# Patient Record
Sex: Female | Born: 1990 | Race: White | Hispanic: No | Marital: Married | Smoking: Never smoker
Health system: Southern US, Community
[De-identification: ages and names within clinical notes are randomized; demographics above are authoritative.]

## PROBLEM LIST (undated history)

## (undated) DIAGNOSIS — Z789 Other specified health status: Secondary | ICD-10-CM

## (undated) HISTORY — PX: NO PAST SURGERIES: SHX2092

## (undated) HISTORY — PX: WISDOM TOOTH EXTRACTION: SHX21

---

## 2020-04-17 ENCOUNTER — Inpatient Hospital Stay (HOSPITAL_COMMUNITY): Admit: 2020-04-17 | Payer: BC Managed Care – PPO | Admitting: Obstetrics and Gynecology

## 2020-05-13 NOTE — L&D Delivery Note (Addendum)
Delivery Note At 4:47 PM a viable female was delivered via Vaginal, Spontaneous (Presentation: Right Occiput Anterior).  APGAR: 8, 9; weight pending.   Placenta status: Spontaneous, Intact.  Cord: 3 vessels with the following complications: None.  Cord pH: n/a  Anesthesia: Epidural Episiotomy: None Lacerations: 2nd degree;Sulcus Suture Repair: 3.0 vicryl rapide Est. Blood Loss (mL):  400  Mom to postpartum.  Baby to Couplet care / Skin to Skin.  Patient had temp 100.2 at 1547 while pushing. Maternal and fetal tachycardia noted.  Tylenol was given and a single dose of Amp/Gent.  Patient and newborn were afebrile at delivery.  Mitchel Honour 11/11/2020, 5:17 PM

## 2020-09-20 ENCOUNTER — Other Ambulatory Visit: Payer: Self-pay

## 2020-09-20 ENCOUNTER — Ambulatory Visit (INDEPENDENT_AMBULATORY_CARE_PROVIDER_SITE_OTHER): Payer: Self-pay | Admitting: Pediatrics

## 2020-09-20 ENCOUNTER — Encounter: Payer: Self-pay | Admitting: Pediatrics

## 2020-09-20 DIAGNOSIS — Z7681 Expectant parent(s) prebirth pediatrician visit: Secondary | ICD-10-CM

## 2020-09-20 NOTE — Progress Notes (Signed)
Prenatal counseling for impending newborn done-- Z76.81  

## 2020-10-23 ENCOUNTER — Inpatient Hospital Stay (HOSPITAL_COMMUNITY): Payer: BC Managed Care – PPO

## 2020-10-23 ENCOUNTER — Inpatient Hospital Stay (HOSPITAL_COMMUNITY)
Admission: AD | Admit: 2020-10-23 | Discharge: 2020-10-23 | Disposition: A | Discharge status: Home / Self Care | Payer: BC Managed Care – PPO | Attending: Obstetrics & Gynecology | Admitting: Obstetrics & Gynecology

## 2020-10-23 ENCOUNTER — Encounter (HOSPITAL_COMMUNITY): Payer: Self-pay | Admitting: *Deleted

## 2020-10-23 DIAGNOSIS — R030 Elevated blood-pressure reading, without diagnosis of hypertension: Secondary | ICD-10-CM | POA: Diagnosis not present

## 2020-10-23 DIAGNOSIS — O99891 Other specified diseases and conditions complicating pregnancy: Secondary | ICD-10-CM | POA: Diagnosis not present

## 2020-10-23 DIAGNOSIS — O26893 Other specified pregnancy related conditions, third trimester: Secondary | ICD-10-CM

## 2020-10-23 DIAGNOSIS — Z3689 Encounter for other specified antenatal screening: Secondary | ICD-10-CM

## 2020-10-23 DIAGNOSIS — Z3A36 36 weeks gestation of pregnancy: Secondary | ICD-10-CM | POA: Insufficient documentation

## 2020-10-23 DIAGNOSIS — R1011 Right upper quadrant pain: Secondary | ICD-10-CM

## 2020-10-23 DIAGNOSIS — O1493 Unspecified pre-eclampsia, third trimester: Secondary | ICD-10-CM | POA: Diagnosis not present

## 2020-10-23 DIAGNOSIS — H538 Other visual disturbances: Secondary | ICD-10-CM | POA: Diagnosis not present

## 2020-10-23 LAB — URINALYSIS, ROUTINE W REFLEX MICROSCOPIC
Bilirubin Urine: NEGATIVE
Glucose, UA: NEGATIVE mg/dL
Hgb urine dipstick: NEGATIVE
Ketones, ur: NEGATIVE mg/dL
Nitrite: NEGATIVE
Protein, ur: NEGATIVE mg/dL
Specific Gravity, Urine: 1.009 (ref 1.005–1.030)
pH: 6 (ref 5.0–8.0)

## 2020-10-23 LAB — CBC
HCT: 36.7 % (ref 36.0–46.0)
Hemoglobin: 12.5 g/dL (ref 12.0–15.0)
MCH: 29.4 pg (ref 26.0–34.0)
MCHC: 34.1 g/dL (ref 30.0–36.0)
MCV: 86.4 fL (ref 80.0–100.0)
Platelets: 341 10*3/uL (ref 150–400)
RBC: 4.25 MIL/uL (ref 3.87–5.11)
RDW: 12 % (ref 11.5–15.5)
WBC: 15.6 10*3/uL — ABNORMAL HIGH (ref 4.0–10.5)
nRBC: 0 % (ref 0.0–0.2)

## 2020-10-23 LAB — COMPREHENSIVE METABOLIC PANEL
ALT: 14 U/L (ref 0–44)
AST: 16 U/L (ref 15–41)
Albumin: 2.9 g/dL — ABNORMAL LOW (ref 3.5–5.0)
Alkaline Phosphatase: 149 U/L — ABNORMAL HIGH (ref 38–126)
Anion gap: 9 (ref 5–15)
BUN: 9 mg/dL (ref 6–20)
CO2: 20 mmol/L — ABNORMAL LOW (ref 22–32)
Calcium: 8.6 mg/dL — ABNORMAL LOW (ref 8.9–10.3)
Chloride: 105 mmol/L (ref 98–111)
Creatinine, Ser: 0.78 mg/dL (ref 0.44–1.00)
GFR, Estimated: >60 mL/min (ref >60)
Glucose, Bld: 108 mg/dL — ABNORMAL HIGH (ref 70–99)
Potassium: 3.6 mmol/L (ref 3.5–5.1)
Sodium: 134 mmol/L — ABNORMAL LOW (ref 135–145)
Total Bilirubin: 0.5 mg/dL (ref 0.3–1.2)
Total Protein: 6 g/dL — ABNORMAL LOW (ref 6.5–8.1)

## 2020-10-23 LAB — PROTEIN / CREATININE RATIO, URINE
Creatinine, Urine: 68.04 mg/dL
Protein Creatinine Ratio: 0.1 mg/mg{Cre} (ref 0.00–0.15)
Total Protein, Urine: 7 mg/dL

## 2020-10-23 NOTE — MAU Note (Signed)
Pt reports visual blurring has resolved. Pt denies headache or any other discomfort. Notified pt RN called and pt should be called for within the hour unless an emergency called-pt will then go to ultrasound. Pt and husband inquiring if they will need to stay for results.

## 2020-10-23 NOTE — MAU Note (Signed)
.  Wendy Mason is a 30 y.o. at [redacted]w[redacted]d here in MAU reporting: x1 episode of blurry vision that happened x 30 minutes ago. Denies HA and swelling. Reports epigastric pain that started Friday. Called her on call doctor and they sent her in to be evaluated. Denies previous BP problems. Endorses fetal movement. Denies VB or LOF.   Pain score:6 Vitals:   10/23/20 1634  BP: (!) 147/101  Pulse: (!) 137  Resp: 19  Temp: 97.9 F (36.6 C)  SpO2: 98%     FHT: 158 Lab orders placed from triage:  UA

## 2020-10-23 NOTE — MAU Provider Note (Signed)
History     CSN: [id number]  Arrival date and time: 10/23/20 1603   Event Date/Time   First Provider Initiated Contact with Patient 10/23/20 1738      Chief Complaint  Patient presents with   Blurred Vision   Ms. Wendy Mason is a 30 y.o. G1P0 at [redacted]w[redacted]d who presents to MAU for preeclampsia evaluation after she experienced an episode of blurry vision x1 today. Patient denies previous episodes of blurry vision. Patient denies wearing glasses or contacts. Patient reports blurry vision was present in both eyes, but reports it was a blurry line in her vision that would not go away with blinking. Patient reports the line was present, did not change shape, and then just disappeared suddenly. Patient originally thought that she was just seeing a glare, but then reports the glare turned in to a blurry line. Patient denies a headache before, during or after this episode. Patient reports frequent HA and reports occasional migraines both during and before pregnancy. Patient reports she did not try any treatment. Patient reports she was watching TV when the blurry vision started. Patient reports she called her OB and was told to come in to MAU for evaluation.  Patient reports she has also been having RUQ pain that started on Friday 10/20/2020. Patient reports the pain has stayed the same since that time. Patient rates the pain as 4/10. Patient reports she has not taken anything for the pain. Patient denies any history of gallstones.  Pt denies seeing spots, N/V, epigastric pain, swelling in face and hands, sudden weight gain. Pt denies chest pain and SOB.  Pt denies constipation, diarrhea, or urinary problems. Pt denies fever, chills, fatigue, sweating or changes in appetite. Pt denies dizziness, light-headedness, weakness.  Pt denies VB, ctx, LOF and reports good FM.  Current pregnancy problems? none Allergies? NKDA Current medications? PNV, VitD Current PNC & next appt? [facility],  10/25/2020   OB History     Gravida  1   Para      Term      Preterm      AB      Living         SAB      IAB      Ectopic      Multiple      Live Births              History reviewed. No pertinent past medical history.  Past Surgical History:  Procedure Laterality Date   WISDOM TOOTH EXTRACTION      History reviewed. No pertinent family history.  Social History   Tobacco Use   Smoking status: Never   Smokeless tobacco: Never  Substance Use Topics   Alcohol use: Not Currently   Drug use: Never    Allergies: No Known Allergies  Medications Prior to Admission  Medication Sig Dispense Refill Last Dose   cholecalciferol (VITAMIN D3) 25 MCG (1000 UNIT) tablet Take 1,000 Units by mouth daily.   10/22/2020   Prenatal Vit-Fe Fumarate-FA (MULTIVITAMIN-PRENATAL) 27-0.8 MG TABS tablet Take 1 tablet by mouth daily at 12 noon.   10/22/2020    Review of Systems  Constitutional:  Negative for chills, diaphoresis, fatigue and fever.  Eyes:  Positive for visual disturbance.  Respiratory:  Negative for shortness of breath.   Cardiovascular:  Negative for chest pain.  Gastrointestinal:  Positive for abdominal pain ((RUQ)). Negative for constipation, diarrhea, nausea and vomiting.  Genitourinary:  Negative for dysuria, flank pain, frequency,  pelvic pain, urgency, vaginal bleeding and vaginal discharge.  Neurological:  Negative for dizziness, weakness, light-headedness and headaches.  Physical Exam   Blood pressure 122/74, pulse 99, temperature 97.9 F (36.6 C), temperature source Oral, resp. rate 17, SpO2 98 %.  Patient Vitals for the past 24 hrs:  BP Temp Temp src Pulse Resp SpO2  10/23/20 1937 122/74 -- -- 99 17 --  10/23/20 1850 -- -- -- -- -- 98 %  10/23/20 1846 126/85 -- -- (!) 114 -- --  10/23/20 1845 -- -- -- -- -- 97 %  10/23/20 1840 -- -- -- -- -- 98 %  10/23/20 1831 119/77 -- -- (!) 113 -- --  10/23/20 1816 115/81 -- -- (!) 110 -- --  10/23/20  1801 118/79 -- -- (!) 107 -- --  10/23/20 1730 127/89 -- -- (!) 105 -- 97 %  10/23/20 1716 127/82 -- -- (!) 113 -- --  10/23/20 1701 (!) 122/93 -- -- (!) 117 -- --  10/23/20 1646 (!) 140/95 -- -- (!) 120 -- --  10/23/20 1634 (!) 147/101 97.9 F (36.6 C) Oral (!) 137 19 98 %   Physical Exam Vitals and nursing note reviewed.  Constitutional:      General: She is not in acute distress.    Appearance: Normal appearance. She is not ill-appearing, toxic-appearing or diaphoretic.  HENT:     Head: Normocephalic and atraumatic.  Eyes:     Pupils: Pupils are equal, round, and reactive to light.  Pulmonary:     Effort: Pulmonary effort is normal.  Abdominal:     General: There is no distension.     Palpations: Abdomen is soft. There is no mass.     Tenderness: There is no abdominal tenderness. There is no guarding.  Neurological:     Mental Status: She is alert and oriented to person, place, and time.  Psychiatric:        Mood and Affect: Mood normal.        Behavior: Behavior normal.        Thought Content: Thought content normal.        Judgment: Judgment normal.   Results for orders placed or performed during the hospital encounter of 10/23/20 (from the past 24 hour(s))  Urinalysis, Routine w reflex microscopic Urine, Clean Catch     Status: Abnormal   Collection Time: 10/23/20  4:40 PM  Result Value Ref Range   Color, Urine YELLOW YELLOW   APPearance HAZY (A) CLEAR   Specific Gravity, Urine 1.009 1.005 - 1.030   pH 6.0 5.0 - 8.0   Glucose, UA NEGATIVE NEGATIVE mg/dL   Hgb urine dipstick NEGATIVE NEGATIVE   Bilirubin Urine NEGATIVE NEGATIVE   Ketones, ur NEGATIVE NEGATIVE mg/dL   Protein, ur NEGATIVE NEGATIVE mg/dL   Nitrite NEGATIVE NEGATIVE   Leukocytes,Ua MODERATE (A) NEGATIVE   RBC / HPF 0-5 0 - 5 RBC/hpf   WBC, UA 6-10 0 - 5 WBC/hpf   Bacteria, UA FEW (A) NONE SEEN   Squamous Epithelial / LPF 0-5 0 - 5  Protein / creatinine ratio, urine     Status: None   Collection  Time: 10/23/20  4:48 PM  Result Value Ref Range   Creatinine, Urine 68.04 mg/dL   Total Protein, Urine 7 mg/dL   Protein Creatinine Ratio 0.10 0.00 - 0.15 mg/mg[Cre]  CBC     Status: Abnormal   Collection Time: 10/23/20  4:52 PM  Result Value Ref Range   WBC 15.6 (  H) 4.0 - 10.5 K/uL   RBC 4.25 3.87 - 5.11 MIL/uL   Hemoglobin 12.5 12.0 - 15.0 g/dL   HCT 81.1 91.4 - 78.2 %   MCV 86.4 80.0 - 100.0 fL   MCH 29.4 26.0 - 34.0 pg   MCHC 34.1 30.0 - 36.0 g/dL   RDW 95.6 21.3 - 08.6 %   Platelets 341 150 - 400 K/uL   nRBC 0.0 0.0 - 0.2 %  Comprehensive metabolic panel     Status: Abnormal   Collection Time: 10/23/20  4:52 PM  Result Value Ref Range   Sodium 134 (L) 135 - 145 mmol/L   Potassium 3.6 3.5 - 5.1 mmol/L   Chloride 105 98 - 111 mmol/L   CO2 20 (L) 22 - 32 mmol/L   Glucose, Bld 108 (H) 70 - 99 mg/dL   BUN 9 6 - 20 mg/dL   Creatinine, Ser 5.78 0.44 - 1.00 mg/dL   Calcium 8.6 (L) 8.9 - 10.3 mg/dL   Total Protein 6.0 (L) 6.5 - 8.1 g/dL   Albumin 2.9 (L) 3.5 - 5.0 g/dL   AST 16 15 - 41 U/L   ALT 14 0 - 44 U/L   Alkaline Phosphatase 149 (H) 38 - 126 U/L   Total Bilirubin 0.5 0.3 - 1.2 mg/dL   GFR, Estimated >46 >96 mL/min   Anion gap 9 5 - 15   MR BRAIN WO CONTRAST  Result Date: 10/23/2020 CLINICAL DATA:  Headache and vision changes EXAM: MRI HEAD WITHOUT CONTRAST TECHNIQUE: Multiplanar, multiecho pulse sequences of the brain and surrounding structures were obtained without intravenous contrast. COMPARISON:  None. FINDINGS: Brain: No acute infarct, mass effect or extra-axial collection. No acute or chronic hemorrhage. Normal white matter signal, parenchymal volume and CSF spaces. The midline structures are normal. Vascular: Major flow voids are preserved. Skull and upper cervical spine: Normal calvarium and skull base. Visualized upper cervical spine and soft tissues are normal. Sinuses/Orbits:No paranasal sinus fluid levels or advanced mucosal thickening. No mastoid or middle ear  effusion. Normal orbits. IMPRESSION: Normal brain MRI. Electronically Signed   By: Deatra Robinson M.D.   On: 10/23/2020 21:34   MR MRV HEAD WO CM  Result Date: 10/23/2020 CLINICAL DATA:  Headache and blurry vision EXAM: MR VENOGRAM HEAD WITHOUT AND WITH CONTRAST TECHNIQUE: Angiographic images of the intracranial venous structures were acquired using MRV technique without intravenous contrast. COMPARISON:  No pertinent prior exam. FINDINGS: Superior sagittal sinus: Normal. Straight sinus: Normal. Inferior sagittal sinus, vein of Galen and internal cerebral veins: Normal. Transverse sinuses: Minimal flow related enhancement of the left transverse sinus, commonly congenital. Normal right transverse sinus. Sigmoid sinuses: Normal. Visualized jugular veins: Normal. 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 Otherwise normal MRV. Electronically Signed   By: Deatra Robinson M.D.   On: 10/23/2020 21:35   US Abdomen Limited RUQ (LIVER/GB)  Result Date: 10/23/2020 CLINICAL DATA:  30 year old female with right upper quadrant abdominal pain. EXAM: ULTRASOUND ABDOMEN LIMITED RIGHT UPPER QUADRANT COMPARISON:  None. FINDINGS: Gallbladder: No gallstones or wall thickening visualized. No sonographic Murphy sign noted by sonographer. Common bile duct: Diameter: 3 mm Liver: No focal lesion identified. Within normal limits in parenchymal echogenicity. Portal vein is patent on color Doppler imaging with normal direction of blood flow towards the liver. Other: None. IMPRESSION: Unremarkable right upper quadrant ultrasound. Electronically Signed   By: Elgie Collard M.D.   On: 10/23/2020 21:38    MAU Course  Procedures  MDM -preeclampsia evaluation without  severe range BP in MAU on admission -symptoms include: blurry vision (no longer present), and RUQ pain (not reproducible on exam) -UA: hazy/mod leuks/few bacteria, sending urine for culture -CBC: H/H 12.5/36.7, platelets 341 -CMP: serum  creatinine 0.78, AST/ALT 16/14 -PCr: 0.10 -EFM: reactive       -baseline: 150/140       -variability: moderate       -accels: present, 15x15       -decels: absent       -TOCO: irritability -called Dr. 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 Otherwise normal MRV. Normal brain MRI.  -consulted with Dr. Jerrell Belfast from neurology who reviewed images and reports MRI is abnormal, but MRV is abnormal with missing venous sinus on one side, whose significance is unknown, and could be congenital. However, Dr. Jerrell Belfast, after speaking with neurology does not report any evidence of thrombosis and she can follow-up outpatient with neurology, specifically Dr. Elam Dutch Neurology - HA specialist  -RUQ [place]: WNL  -pt discharged to home in stable condition  Orders Placed This Encounter  Procedures   Culture, OB Urine    Standing Status:   Standing    Number of Occurrences:   1   US Abdomen Limited RUQ (LIVER/GB)    Standing Status:   Standing    Number of Occurrences:   1    Order Specific Question:   Symptom/Reason for Exam    Answer:   RUQ pain [251471]   MR BRAIN WO CONTRAST    Standing Status:   Standing    Number of Occurrences:   1    Order Specific Question:   What is the patient's sedation requirement?    Answer:   No Sedation    Order Specific Question:   Does the patient have a pacemaker or implanted devices?    Answer:   No   MR MRV HEAD WO CM    Standing Status:   Standing    Number of Occurrences:   1    Order Specific Question:   What is the patient's sedation requirement?    Answer:   No Sedation    Order Specific Question:   Does the patient have a pacemaker or implanted devices?    Answer:   No   Urinalysis, Routine w reflex microscopic Urine, Clean Catch    Standing Status:   Standing    Number of Occurrences:   1   CBC    Standing Status:   Standing    Number of Occurrences:   1    Comprehensive metabolic panel    Standing Status:   Standing    Number of Occurrences:   1   Protein / creatinine ratio, urine    Standing Status:   Standing    Number of Occurrences:   1   Ambulatory referral to Neurology    Referral Priority:   Routine    Referral Type:   Consultation    Referral Reason:   Specialty Services Required    Referred to Provider:   Anson Fret, MD    Requested Specialty:   Neurology    Number of Visits Requested:   1   Discharge patient    Order Specific Question:   Discharge disposition    Answer:   01-Home or Self Care [1]    Order Specific Question:   Discharge patient date    Answer:   10/23/2020  No orders of the defined types were placed in this encounter.  Assessment and Plan   1. Elevated blood pressure reading in office without diagnosis of hypertension   2. RUQ pain   3. [redacted] weeks gestation of pregnancy   4. NST (non-stress test) reactive   5. Blurred vision     Allergies as of 10/23/2020   No Known Allergies      Medication List     TAKE these medications    cholecalciferol 25 MCG (1000 UNIT) tablet Commonly known as: VITAMIN D3 Take 1,000 Units by mouth daily.   multivitamin-prenatal 27-0.8 MG Tabs tablet Take 1 tablet by mouth daily at 12 noon.       -pt advised to keep OB appt for 10/25/2020 to check on BP -Reviewed warning blood pressure values (systolic = / > 140 and/or diastolic =/> 90). Explained that, if blood pressure is elevated, she should sit down, rest, and eat/drink something. If still elevated 15 minutes later, and she is greater than 20 weeks, she should call clinic or come to MAU. She should come to MAU if she has elevated pressures and any of the following:  -headache not relieved with tylenol, rest, hydration -blurry vision, floating spots in her vision -sudden full-body edema or facial edema -RUQ pain that is constant. -chest pain or shortness of breath -new onset or sudden worsening of nausea  and vomiting These symptoms may indicate that her blood pressure is worsening and she may be developing gestational hypertension or pre-eclampsia, which is an emergency.  -return MAU precautions given -pt discharged to home in stable condition  Odie Seraicole E Smt. Loder 10/23/2020, 9:48 PM

## 2020-10-25 LAB — CULTURE, OB URINE

## 2020-11-11 ENCOUNTER — Other Ambulatory Visit: Payer: Self-pay

## 2020-11-11 ENCOUNTER — Inpatient Hospital Stay (HOSPITAL_COMMUNITY): Payer: BC Managed Care – PPO | Admitting: Anesthesiology

## 2020-11-11 ENCOUNTER — Inpatient Hospital Stay (HOSPITAL_COMMUNITY)
Admission: AD | Admit: 2020-11-11 | Discharge: 2020-11-13 | DRG: 805 | Disposition: A | Discharge status: Home / Self Care | Payer: BC Managed Care – PPO | Attending: Obstetrics & Gynecology | Admitting: Obstetrics & Gynecology

## 2020-11-11 ENCOUNTER — Encounter (HOSPITAL_COMMUNITY): Payer: Self-pay | Admitting: Obstetrics & Gynecology

## 2020-11-11 DIAGNOSIS — O4292 Full-term premature rupture of membranes, unspecified as to length of time between rupture and onset of labor: Principal | ICD-10-CM | POA: Diagnosis present

## 2020-11-11 DIAGNOSIS — Z20822 Contact with and (suspected) exposure to covid-19: Secondary | ICD-10-CM | POA: Diagnosis present

## 2020-11-11 DIAGNOSIS — D62 Acute posthemorrhagic anemia: Secondary | ICD-10-CM | POA: Diagnosis not present

## 2020-11-11 DIAGNOSIS — O411230 Chorioamnionitis, third trimester, not applicable or unspecified: Secondary | ICD-10-CM | POA: Diagnosis present

## 2020-11-11 DIAGNOSIS — Z23 Encounter for immunization: Secondary | ICD-10-CM | POA: Diagnosis not present

## 2020-11-11 DIAGNOSIS — Z3A38 38 weeks gestation of pregnancy: Secondary | ICD-10-CM

## 2020-11-11 DIAGNOSIS — Z3490 Encounter for supervision of normal pregnancy, unspecified, unspecified trimester: Secondary | ICD-10-CM

## 2020-11-11 DIAGNOSIS — O9081 Anemia of the puerperium: Secondary | ICD-10-CM | POA: Diagnosis not present

## 2020-11-11 DIAGNOSIS — O99824 Streptococcus B carrier state complicating childbirth: Secondary | ICD-10-CM | POA: Diagnosis present

## 2020-11-11 HISTORY — DX: Other specified health status: Z78.9

## 2020-11-11 LAB — CBC
HCT: 36.8 % (ref 36.0–46.0)
Hemoglobin: 12.3 g/dL (ref 12.0–15.0)
MCH: 29.1 pg (ref 26.0–34.0)
MCHC: 33.4 g/dL (ref 30.0–36.0)
MCV: 87.2 fL (ref 80.0–100.0)
Platelets: 350 10*3/uL (ref 150–400)
RBC: 4.22 MIL/uL (ref 3.87–5.11)
RDW: 12.3 % (ref 11.5–15.5)
WBC: 15.4 10*3/uL — ABNORMAL HIGH (ref 4.0–10.5)
nRBC: 0 % (ref 0.0–0.2)

## 2020-11-11 LAB — TYPE AND SCREEN
ABO/RH(D): A POS
Antibody Screen: NEGATIVE

## 2020-11-11 LAB — RESP PANEL BY RT-PCR (FLU A&B, COVID) ARPGX2
Influenza A by PCR: NEGATIVE
Influenza B by PCR: NEGATIVE
SARS Coronavirus 2 by RT PCR: NEGATIVE

## 2020-11-11 LAB — RPR: RPR Ser Ql: NONREACTIVE

## 2020-11-11 MED ORDER — EPHEDRINE 5 MG/ML INJ: 10.0000 mg | INTRAVENOUS | Status: DC | PRN

## 2020-11-11 MED ORDER — DIPHENHYDRAMINE HCL 25 MG PO CAPS: 25.0000 mg | ORAL_CAPSULE | Freq: Four times a day (QID) | ORAL | Status: DC | PRN

## 2020-11-11 MED ORDER — OXYCODONE-ACETAMINOPHEN 5-325 MG PO TABS
2.0000 | ORAL_TABLET | ORAL | Status: DC | PRN
Start: 2020-11-11 — End: 2020-11-11

## 2020-11-11 MED ORDER — DIBUCAINE (PERIANAL) 1 % EX OINT: 1.0000 "application " | TOPICAL_OINTMENT | CUTANEOUS | Status: DC | PRN

## 2020-11-11 MED ORDER — ACETAMINOPHEN 325 MG PO TABS
650.0000 mg | ORAL_TABLET | ORAL | Status: DC | PRN
  Administered 2020-11-11: 650 mg via ORAL
  Filled 2020-11-11: qty 2

## 2020-11-11 MED ORDER — PRENATAL MULTIVITAMIN CH
1.0000 | ORAL_TABLET | Freq: Every day | ORAL | Status: DC
  Administered 2020-11-12 – 2020-11-13 (×2): 1 via ORAL
  Filled 2020-11-11 (×2): qty 1

## 2020-11-11 MED ORDER — TETANUS-DIPHTH-ACELL PERTUSSIS 5-2.5-18.5 LF-MCG/0.5 IM SUSY: 0.5000 mL | PREFILLED_SYRINGE | Freq: Once | INTRAMUSCULAR | Status: DC

## 2020-11-11 MED ORDER — COCONUT OIL OIL: 1.0000 "application " | TOPICAL_OIL | Status: DC | PRN

## 2020-11-11 MED ORDER — FLEET ENEMA 7-19 GM/118ML RE ENEM: 1.0000 | ENEMA | RECTAL | Status: DC | PRN

## 2020-11-11 MED ORDER — GENTAMICIN SULFATE 40 MG/ML IJ SOLN
5.0000 mg/kg | Freq: Once | INTRAVENOUS | Status: AC
  Administered 2020-11-11: 340 mg via INTRAVENOUS
  Filled 2020-11-11: qty 8.5

## 2020-11-11 MED ORDER — FENTANYL-BUPIVACAINE-NACL 0.5-0.125-0.9 MG/250ML-% EP SOLN
12.0000 mL/h | EPIDURAL | Status: DC | PRN
  Administered 2020-11-11: 12 mL/h via EPIDURAL
  Filled 2020-11-11: qty 250

## 2020-11-11 MED ORDER — ZOLPIDEM TARTRATE 5 MG PO TABS: 5.0000 mg | ORAL_TABLET | Freq: Every evening | ORAL | Status: DC | PRN

## 2020-11-11 MED ORDER — WITCH HAZEL-GLYCERIN EX PADS: 1.0000 "application " | MEDICATED_PAD | CUTANEOUS | Status: DC | PRN

## 2020-11-11 MED ORDER — SODIUM CHLORIDE 0.9 % IV SOLN
2.0000 g | Freq: Once | INTRAVENOUS | Status: AC
  Administered 2020-11-11: 2 g via INTRAVENOUS
  Filled 2020-11-11: qty 2000

## 2020-11-11 MED ORDER — PHENYLEPHRINE 40 MCG/ML (10ML) SYRINGE FOR IV PUSH (FOR BLOOD PRESSURE SUPPORT): 80.0000 ug | PREFILLED_SYRINGE | INTRAVENOUS | Status: DC | PRN

## 2020-11-11 MED ORDER — TERBUTALINE SULFATE 1 MG/ML IJ SOLN: 0.2500 mg | Freq: Once | INTRAMUSCULAR | Status: DC | PRN

## 2020-11-11 MED ORDER — TRANEXAMIC ACID-NACL 1000-0.7 MG/100ML-% IV SOLN
INTRAVENOUS | Status: AC
  Filled 2020-11-11: qty 100

## 2020-11-11 MED ORDER — SENNOSIDES-DOCUSATE SODIUM 8.6-50 MG PO TABS
2.0000 | ORAL_TABLET | Freq: Every day | ORAL | Status: DC
  Administered 2020-11-12 – 2020-11-13 (×2): 2 via ORAL
  Filled 2020-11-11 (×2): qty 2

## 2020-11-11 MED ORDER — ONDANSETRON HCL 4 MG/2ML IJ SOLN: 4.0000 mg | Freq: Four times a day (QID) | INTRAMUSCULAR | Status: DC | PRN

## 2020-11-11 MED ORDER — OXYCODONE-ACETAMINOPHEN 5-325 MG PO TABS: 2.0000 | ORAL_TABLET | ORAL | Status: DC | PRN

## 2020-11-11 MED ORDER — SIMETHICONE 80 MG PO CHEW: 80.0000 mg | CHEWABLE_TABLET | ORAL | Status: DC | PRN

## 2020-11-11 MED ORDER — OXYCODONE-ACETAMINOPHEN 5-325 MG PO TABS: 1.0000 | ORAL_TABLET | ORAL | Status: DC | PRN

## 2020-11-11 MED ORDER — LACTATED RINGERS IV SOLN: INTRAVENOUS | Status: DC

## 2020-11-11 MED ORDER — IBUPROFEN 600 MG PO TABS
600.0000 mg | ORAL_TABLET | Freq: Four times a day (QID) | ORAL | Status: DC
  Administered 2020-11-12 – 2020-11-13 (×7): 600 mg via ORAL
  Filled 2020-11-11 (×7): qty 1

## 2020-11-11 MED ORDER — LACTATED RINGERS IV SOLN
500.0000 mL | INTRAVENOUS | Status: DC | PRN
  Administered 2020-11-11: 500 mL via INTRAVENOUS

## 2020-11-11 MED ORDER — LACTATED RINGERS IV SOLN
500.0000 mL | Freq: Once | INTRAVENOUS | Status: AC
  Administered 2020-11-11: 500 mL via INTRAVENOUS

## 2020-11-11 MED ORDER — LIDOCAINE HCL (PF) 1 % IJ SOLN: 30.0000 mL | INTRAMUSCULAR | Status: DC | PRN

## 2020-11-11 MED ORDER — ONDANSETRON HCL 4 MG/2ML IJ SOLN: 4.0000 mg | INTRAMUSCULAR | Status: DC | PRN

## 2020-11-11 MED ORDER — FENTANYL CITRATE (PF) 100 MCG/2ML IJ SOLN: 50.0000 ug | INTRAMUSCULAR | Status: DC | PRN

## 2020-11-11 MED ORDER — PENICILLIN G POT IN DEXTROSE 60000 UNIT/ML IV SOLN
3.0000 10*6.[IU] | INTRAVENOUS | Status: DC
  Administered 2020-11-11 (×2): 3 10*6.[IU] via INTRAVENOUS
  Filled 2020-11-11 (×5): qty 50

## 2020-11-11 MED ORDER — LIDOCAINE HCL (PF) 1 % IJ SOLN
INTRAMUSCULAR | Status: DC | PRN
  Administered 2020-11-11: 3 mL via EPIDURAL

## 2020-11-11 MED ORDER — ONDANSETRON HCL 4 MG PO TABS: 4.0000 mg | ORAL_TABLET | ORAL | Status: DC | PRN

## 2020-11-11 MED ORDER — OXYTOCIN BOLUS FROM INFUSION: 333.0000 mL | Freq: Once | INTRAVENOUS | Status: DC

## 2020-11-11 MED ORDER — OXYTOCIN-SODIUM CHLORIDE 30-0.9 UT/500ML-% IV SOLN
1.0000 m[IU]/min | INTRAVENOUS | Status: DC
  Administered 2020-11-11: 2 m[IU]/min via INTRAVENOUS
  Filled 2020-11-11: qty 500

## 2020-11-11 MED ORDER — TRANEXAMIC ACID-NACL 1000-0.7 MG/100ML-% IV SOLN: 1000.0000 mg | Freq: Once | INTRAVENOUS | Status: DC

## 2020-11-11 MED ORDER — BENZOCAINE-MENTHOL 20-0.5 % EX AERO
1.0000 "application " | INHALATION_SPRAY | CUTANEOUS | Status: DC | PRN
  Administered 2020-11-12: 1 via TOPICAL
  Filled 2020-11-11: qty 56

## 2020-11-11 MED ORDER — SOD CITRATE-CITRIC ACID 500-334 MG/5ML PO SOLN
30.0000 mL | ORAL | Status: DC | PRN
  Filled 2020-11-11: qty 30

## 2020-11-11 MED ORDER — DIPHENHYDRAMINE HCL 50 MG/ML IJ SOLN: 12.5000 mg | INTRAMUSCULAR | Status: DC | PRN

## 2020-11-11 MED ORDER — ACETAMINOPHEN 325 MG PO TABS: 650.0000 mg | ORAL_TABLET | ORAL | Status: DC | PRN

## 2020-11-11 MED ORDER — SODIUM CHLORIDE 0.9 % IV SOLN
5.0000 10*6.[IU] | Freq: Once | INTRAVENOUS | Status: AC
  Administered 2020-11-11: 5 10*6.[IU] via INTRAVENOUS
  Filled 2020-11-11: qty 5

## 2020-11-11 MED ORDER — OXYTOCIN-SODIUM CHLORIDE 30-0.9 UT/500ML-% IV SOLN: 2.5000 [IU]/h | INTRAVENOUS | Status: DC

## 2020-11-11 NOTE — H&P (Signed)
Wendy Mason is a 30 y.o. female G1 at [redacted]w[redacted]d presenting for PROM at 1130 pm last night.  She reports just recently CTX became more noticeable.  No VB.  Active FM.  Antepartum course uncomplicated.  GBS positive.  OB History     Gravida  1   Para      Term      Preterm      AB      Living         SAB      IAB      Ectopic      Multiple      Live Births             Past Medical History:  Diagnosis Date   Medical history non-contributory    Past Surgical History:  Procedure Laterality Date   NO PAST SURGERIES     WISDOM TOOTH EXTRACTION     Family History: family history is not on file. Social History:  reports that she has never smoked. She has never used smokeless tobacco. She reports previous alcohol use. She reports that she does not use drugs.     Maternal Diabetes: No Genetic Screening: Normal Maternal Ultrasounds/Referrals: Normal Fetal Ultrasounds or other Referrals:  None Maternal Substance Abuse:  No Significant Maternal Medications:  None Significant Maternal Lab Results:  Group B Strep positive Other Comments:  None  Review of Systems Maternal Medical History:  Reason for admission: Rupture of membranes.   Contractions: Onset was 1-2 hours ago.   Frequency: irregular.   Perceived severity is mild.   Fetal activity: Perceived fetal activity is normal.   Last perceived fetal movement was within the past hour.   Prenatal complications: no prenatal complications Prenatal Complications - Diabetes: none.  Dilation: 3.5 Effacement (%): 80 Station: -2 Exam by:: Derinda Late, RN Blood pressure (!) 132/95, pulse (!) 123, temperature (!) 97.4 F (36.3 C), temperature source Oral, height 5\' 3"  (1.6 m), weight 91.9 kg, SpO2 99 %. Maternal Exam:  Uterine Assessment: Contraction strength is mild.  Contraction frequency is irregular.  Abdomen: Patient reports no abdominal tenderness. Fundal height is c/w dates.   Estimated fetal weight is 7#6.      Fetal Exam Fetal Monitor Review: Baseline rate: 140.  Variability: moderate (6-25 bpm).   Pattern: accelerations present and no decelerations.   Fetal State Assessment: Category I - tracings are normal.  Physical Exam Constitutional:      Appearance: Normal appearance.  HENT:     Head: Normocephalic and atraumatic.  Pulmonary:     Effort: Pulmonary effort is normal.  Abdominal:     Palpations: Abdomen is soft.  Musculoskeletal:        General: Normal range of motion.     Cervical back: Normal range of motion.  Neurological:     Mental Status: She is alert and oriented to person, place, and time.  Psychiatric:        Mood and Affect: Mood normal.        Behavior: Behavior normal.    Prenatal labs: ABO, Rh: --/--/A POS (07/02 0135) Antibody: NEG (07/02 0135) Rubella:  Non-immune RPR:   NR HBsAg:   Negative HIV:   NR GBS:   Positive  Assessment/Plan: 29yo G1 at [redacted]w[redacted]d with PROM -Pitocin started -CLEA if desired -PCN for GBS ppx -Anticipate NSVD   [redacted]w[redacted]d 11/11/2020, 7:01 AM

## 2020-11-11 NOTE — Anesthesia Procedure Notes (Addendum)
Epidural Patient location during procedure: OB Start time: 11/11/2020 8:17 AM End time: 11/11/2020 8:35 AM  Staffing Anesthesiologist: Lewie Loron, MD Performed: anesthesiologist   Preanesthetic Checklist Completed: patient identified, IV checked, risks and benefits discussed, monitors and equipment checked, pre-op evaluation and timeout performed  Epidural Patient position: sitting Prep: DuraPrep and site prepped and draped Patient monitoring: heart rate, continuous pulse ox and blood pressure Approach: midline Location: L3-L4 Injection technique: LOR air and LOR saline  Needle:  Needle type: Tuohy  Needle gauge: 17 G Needle length: 9 cm Needle insertion depth: 5.5 cm Catheter type: closed end flexible Catheter size: 19 Gauge Catheter at skin depth: 11 cm Test dose: negative  Assessment Sensory level: T8 Events: blood not aspirated, injection not painful, no injection resistance, no paresthesia and negative IV test  Additional Notes Reason for block:procedure for pain

## 2020-11-11 NOTE — Anesthesia Preprocedure Evaluation (Signed)
Anesthesia Evaluation  Patient identified by MRN, date of birth, ID band Patient awake    Reviewed: Allergy & Precautions, Patient's Chart, lab work & pertinent test results  Airway Mallampati: II  TM Distance: >3 FB Neck ROM: Full    Dental no notable dental hx.    Pulmonary neg pulmonary ROS,    Pulmonary exam normal breath sounds clear to auscultation       Cardiovascular negative cardio ROS Normal cardiovascular exam Rhythm:Regular Rate:Normal     Neuro/Psych negative neurological ROS     GI/Hepatic negative GI ROS, Neg liver ROS,   Endo/Other  negative endocrine ROS  Renal/GU negative Renal ROS     Musculoskeletal negative musculoskeletal ROS (+)   Abdominal (+) + obese,   Peds  Hematology negative hematology ROS (+)   Anesthesia Other Findings   Reproductive/Obstetrics (+) Pregnancy                             Anesthesia Physical Anesthesia Plan  ASA: 2  Anesthesia Plan: Epidural   Post-op Pain Management:    Induction:   PONV Risk Score and Plan:   Airway Management Planned:   Additional Equipment:   Intra-op Plan:   Post-operative Plan:   Informed Consent: I have reviewed the patients History and Physical, chart, labs and discussed the procedure including the risks, benefits and alternatives for the proposed anesthesia with the patient or authorized representative who has indicated his/her understanding and acceptance.       Plan Discussed with:   Anesthesia Plan Comments:         Anesthesia Quick Evaluation  

## 2020-11-11 NOTE — Lactation Note (Signed)
This note was copied from a baby's chart. Lactation Consultation Note  Patient Name: Wendy Mason AJOIN'O Date: 11/11/2020 Reason for consult: L&D Initial assessment;Mother's request;Difficult latch;Primapara;1st time breastfeeding;Early term 37-38.6wks Age: < 1hr Infant cueing on arrival. LC removed amniotic fluid from infants mouth.  LC assisted latching infant in cross cradle. Mom's nipples short shafted with some areola edema. Nipples will evert with stimulation. Infant latched in laid back position on top with a tea cup hold with a few suck swallows with breast compression.   Mom really tired so we will continue to work on latch on the floor. Mom to do hand expression and offer drops of colostrum at the breast.  All questions answered during the visit.   Maternal Data Has patient been taught Hand Expression?: Yes Does the patient have breastfeeding experience prior to this delivery?: No  Feeding Mother's Current Feeding Choice: Breast Milk  LATCH Score Latch: Repeated attempts needed to sustain latch, nipple held in mouth throughout feeding, stimulation needed to elicit sucking reflex.  Audible Swallowing: A few with stimulation  Type of Nipple: Flat (areola edema but with stimulation will evert)  Comfort (Breast/Nipple): Soft / non-tender  Hold (Positioning): Assistance needed to correctly position infant at breast and maintain latch.  LATCH Score: 6   Lactation Tools Discussed/Used    Interventions Interventions: Breast feeding basics reviewed;Assisted with latch;Adjust position;Skin to skin;Support pillows;Breast massage;Hand express;Expressed milk;Education;Position options;Breast compression  Discharge Pump: Personal WIC Program: No  Consult Status Consult Status: Follow-up Date: 11/12/20 Follow-up type: In-patient    Morton Simson  Nicholson-Springer 11/11/2020, 6:02 PM

## 2020-11-11 NOTE — MAU Note (Signed)
Pt reports gush of clear fluid 11/10/2020 around 2330. Cont to leak fluid. Denies VB and reports +FM. Reports cramping but not ctx.

## 2020-11-11 NOTE — Progress Notes (Signed)
Azia Toutant is a 30 y.o. G1P0 at [redacted]w[redacted]d by ultrasound admitted for rupture of membranes  Subjective: Comfortable with CLEA  Objective: BP 113/67   Pulse (!) 101   Temp 99 F (37.2 C) (Oral)   Resp 18   Ht 5\' 3"  (1.6 m)   Wt 91.9 kg   SpO2 99%   BMI 35.87 kg/m  No intake/output data recorded. Total I/O In: -  Out: 350 [Urine:350]  FHT:  FHR: 140 bpm, variability: moderate,  accelerations:  Abscent,  decelerations:  Present variable UC:   regular, every 2 minutes SVE:   Dilation: 8 Effacement (%): 90 Station: -1 Exam by:: [email or web address], RNC  Labs: Lab Results  Component Value Date   WBC 15.4 (H) 11/11/2020   HGB 12.3 11/11/2020   HCT 36.8 11/11/2020   MCV 87.2 11/11/2020   PLT 350 11/11/2020    Assessment / Plan: Augmentation of labor, progressing well  Labor: Progressing normally Preeclampsia:   n/a Fetal Wellbeing:  Category II; variables resolved with position change and IVF bolus; Category I at this time Pain Control:  Epidural I/D:  n/a Anticipated MOD:  NSVD  01/12/2021 11/11/2020, 12:44 PM

## 2020-11-12 LAB — CBC
HCT: 26.5 % — ABNORMAL LOW (ref 36.0–46.0)
Hemoglobin: 8.8 g/dL — ABNORMAL LOW (ref 12.0–15.0)
MCH: 29.1 pg (ref 26.0–34.0)
MCHC: 33.2 g/dL (ref 30.0–36.0)
MCV: 87.7 fL (ref 80.0–100.0)
Platelets: 267 10*3/uL (ref 150–400)
RBC: 3.02 MIL/uL — ABNORMAL LOW (ref 3.87–5.11)
RDW: 12.5 % (ref 11.5–15.5)
WBC: 20 10*3/uL — ABNORMAL HIGH (ref 4.0–10.5)
nRBC: 0 % (ref 0.0–0.2)

## 2020-11-12 MED ORDER — MEASLES, MUMPS & RUBELLA VAC IJ SOLR
0.5000 mL | Freq: Once | INTRAMUSCULAR | Status: AC
  Administered 2020-11-13: 0.5 mL via SUBCUTANEOUS
  Filled 2020-11-12: qty 0.5

## 2020-11-12 MED ORDER — FERROUS SULFATE 325 (65 FE) MG PO TABS
325.0000 mg | ORAL_TABLET | Freq: Every day | ORAL | Status: DC
  Administered 2020-11-12 – 2020-11-13 (×2): 325 mg via ORAL
  Filled 2020-11-12 (×2): qty 1

## 2020-11-12 NOTE — Lactation Note (Signed)
This note was copied from a baby's chart. Lactation Consultation Note Baby 13 hrs old.  Mom unable to have a feeding d/t short shaft nipples and edema to breast. Non-compressible breast.  Attempted to latch baby. Baby fussy at breast. Unable obtain deep latch. Gave mom shells to wear today. Fitted mom w/#24 NS. Baby suckled a few times but mainly held in mouth.  Hand expression taught. Collected a few drops of colostrum approx. 1 ml. Spoon fed baby.  Noted several times baby acting as if going to spit up but swallowing it down.  Discussed w/mom about pumping. Mom in agreement. Mom shown how to use DEBP & how to disassemble, clean, & reassemble parts. Mom knows to pump q3h for 15-20 min. Mom encouraged to feed baby 8-12 times/24 hours and with feeding cues.    Mom will need help in latching. Mom is to wear shells between feedings, Use DEBP Q3 hr.  Pre-pump w/hand pump attachment prior to application of NS. NS for feedings.  Patient Name: Wendy Mason [id number] Date: 11/12/2020 Reason for consult: Initial assessment;Primapara;Early term 37-38.6wks Age:30 hours  Maternal Data Has patient been taught Hand Expression?: Yes Does the patient have breastfeeding experience prior to this delivery?: No  Feeding    LATCH Score Latch: Too sleepy or reluctant, no latch achieved, no sucking elicited.  Audible Swallowing: None  Type of Nipple: Everted at rest and after stimulation (short shaft)  Comfort (Breast/Nipple): Filling, red/small blisters or bruises, mild/mod discomfort (breast heavy/edema)  Hold (Positioning): Full assist, staff holds infant at breast  LATCH Score: 3   Lactation Tools Discussed/Used Tools: Shells;Pump;Nipple Shields Nipple shield size: 24 Breast pump type: Double-Electric Breast Pump Pump Education: Setup, frequency, and cleaning;Milk Storage Reason for Pumping: NS Pumping frequency: Q 3h  Interventions Interventions: Breast feeding basics  reviewed;Support pillows;Assisted with latch;Position options;Skin to skin;Expressed milk;Breast massage;Hand express;Shells;Pre-pump if needed;Reverse pressure;DEBP;Breast compression;Adjust position  Discharge [facility] Program: No  Consult Status Consult Status: Follow-up Date: 11/12/20 Follow-up type: In-patient    Charyl Dancer 11/12/2020, 6:26 AM

## 2020-11-12 NOTE — Anesthesia Postprocedure Evaluation (Signed)
Anesthesia Post Note  Patient: Deiona Hooper  Procedure(s) Performed: AN AD HOC LABOR EPIDURAL     Patient location during evaluation: Mother Baby Anesthesia Type: Epidural Level of consciousness: awake and alert Pain management: pain level controlled Vital Signs Assessment: post-procedure vital signs reviewed and stable Respiratory status: spontaneous breathing, nonlabored ventilation and respiratory function stable Cardiovascular status: stable Postop Assessment: no headache, no backache, epidural receding, no apparent nausea or vomiting, patient able to bend at knees, adequate PO intake and able to ambulate Anesthetic complications: no   No notable events documented.  Last Vitals:  Vitals:   11/11/20 2350 11/12/20 0500  BP: 108/66 101/67  Pulse: 98 99  Resp: 18 17  Temp: 37.1 C   SpO2:      Last Pain:  Vitals:   11/11/20 2350  TempSrc: Oral  PainSc:    Pain Goal:                   [place]

## 2020-11-12 NOTE — Lactation Note (Signed)
This note was copied from a baby's chart. Lactation Consultation Note  Patient Name: Wendy Mason [id number] Date: 11/12/2020 Reason for consult: Mother's request;Follow-up assessment;Primapara;1st time breastfeeding;Early term 37-38.6wks Age:30 hours  LC in to room per mother's request. Mother is getting ready to breastfeed. Infant started gagging and had some undigested milk emesis (documented by RN). LC placed infant skin to skin and suggested feeding at a later time.  Discussed and demonstrated using hand pump to collect EBM and spoonfeed infant. Mother reports discomfort with HE.  All questions answered at this time. Encouraged to contact [facility] for support, questions or concerns.   Feeding Mother's Current Feeding Choice: Breast Milk  LATCH Score Latch: Grasps breast easily, tongue down, lips flanged, rhythmical sucking.  Audible Swallowing: None  Type of Nipple: Everted at rest and after stimulation  Comfort (Breast/Nipple): Filling, red/small blisters or bruises, mild/mod discomfort  Hold (Positioning): Assistance needed to correctly position infant at breast and maintain latch.  LATCH Score: 6   Lactation Tools Discussed/Used Tools: Pump;Flanges Flange Size: 24 Breast pump type: Double-Electric Breast Pump;Manual Reason for Pumping: stimulation and supplementation Pumping frequency: as needed Pumped volume:  (drops)  Interventions Interventions: Breast feeding basics reviewed;Skin to skin;Hand express;Breast massage;Hand pump;DEBP;Expressed milk;Education  Consult Status Consult Status: Follow-up Date: 11/13/20 Follow-up type: In-patient    Wendy Mason 11/12/2020, 2:05 PM

## 2020-11-12 NOTE — Lactation Note (Signed)
This note was copied from a baby's chart. Lactation Consultation Note  Patient Name: Wendy Mason Date: 11/12/2020 Reason for consult: Mother's request;Follow-up assessment;Difficult latch Age:30 hours  LC in to room for latch assistance. Several unsuccessful attempts to latch. Used 24 mm nipple shield with donor milk and attempted latch to left breast, football position. Achieved a deep latch after a few attempts. Mother demonstrates proper neck extension and back support. Infant has audible swallows and deep tugs. Infant still breastfeeding when LC left room.  Plan:   1-Breastfeeding on demand, ensuring a deep, comfortable latch using NS and donor milk inside shield. 2-Keep infant awake during breastfeeding session: massaging breast, infant's hand/shoulder/feet 3-Monitor voids and stools as signs good intake.  4-Pump using initiation setting after each feeding for breast stimulation 5-Promoted maternal self care 6-Contact LC as needed for feeds/support/concerns/questions   Feeding Mother's Current Feeding Choice: Breast Milk and Donor Milk  LATCH Score Latch: Grasps breast easily, tongue down, lips flanged, rhythmical sucking.  Audible Swallowing: Spontaneous and intermittent  Type of Nipple: Everted at rest and after stimulation (short shafted)  Comfort (Breast/Nipple): Filling, red/small blisters or bruises, mild/mod discomfort  Hold (Positioning): Assistance needed to correctly position infant at breast and maintain latch.  LATCH Score: 8   Lactation Tools Discussed/Used Tools: Pump;Flanges;Comfort gels;Nipple Shields Nipple shield size: 24 Flange Size: 24 Breast pump type: Double-Electric Breast Pump;Manual Reason for Pumping: stimulation and supplementation, NS Pumping frequency: after feedings Pumped volume:  (drops)  Interventions Interventions: Assisted with latch;Skin to skin;Hand express;Breast massage;Education;DEBP  Discharge Pump:  Personal;Manual;DEBP  Consult Status Consult Status: Follow-up Date: 11/13/20 Follow-up type: In-patient    Elizjah Noblet A Higuera Ancidey 11/12/2020, 5:51 PM

## 2020-11-12 NOTE — Progress Notes (Signed)
Post Partum Day 1 Subjective: no complaints, up ad lib, voiding, and tolerating PO.  Declines circumcision.  Objective: Blood pressure 101/67, pulse 99, temperature 98.7 F (37.1 C), temperature source Oral, resp. rate 17, height 5\' 3"  (1.6 m), weight 91.9 kg, SpO2 100 %, unknown if currently breastfeeding.  Physical Exam:  General: alert, cooperative, and appears stated age 30: appropriate Uterine Fundus: firm Incision: healing well, no significant drainage, no dehiscence DVT Evaluation: No evidence of DVT seen on physical exam. Negative Homan's sign. No cords or calf tenderness.  Recent Labs    11/11/20 0134 11/12/20 0450  HGB 12.3 8.8*  HCT 36.8 26.5*  WBC 15.4->20  Assessment/Plan: Plan for discharge tomorrow and Breastfeeding Acute blood loss anemia-FeSO4 started.   Choriomnionitis-afebrile since delivery.  Will recheck CBC tomorrow AM.   LOS: 1 day   01/13/21 11/12/2020, 7:57 AM

## 2020-11-12 NOTE — Lactation Note (Signed)
This note was copied from a baby's chart. Lactation Consultation Note  Patient Name: Boy Nami Strawder [id number] Date: 11/12/2020 Reason for consult: Mother's request;Difficult latch;Primapara;1st time breastfeeding;Early term 37-38.6wks Age:30 hours  LC in to room per mother's request. Mother seems tearful and tired. Parents report infant has been latching but does not seem to sustain latch. Infant seems to be clusterfeeding. Mother explains she is pumping with both DEBP and manual but unable to collect any EBM. Infant is cueing. LC inquired about donor milk provided earlier. Parents explain they have not used it because infant was latching.  LC and father paced bottlefed ~18 mL of donor milk. LC able to collect ~1 mL of colostrum when using manual pump and fingerfed infant. Mother used DEBP initiation setting for stimulation purposes.  PLAN:  1-Feed infant donor milk according age appropriate supplementation guidelines (15-30 mL) 2-Pump using initiation setting each time baby gets supplementation 3-keep baby upright position for at 20 minutes after each feeding.  4-Parents self care 5-Monitor I&O's     Feeding Mother's Current Feeding Choice: Breast Milk and Donor Milk Nipple Type: Extra Slow Flow  Lactation Tools Discussed/Used Tools: Pump;Flanges;Comfort gels;Nipple Dorris Carnes;Bottle Nipple shield size: 20 Flange Size: 24;21 Breast pump type: Double-Electric Breast Pump;Manual Reason for Pumping: stimulation and supplementation Pumping frequency: after each supplementation Pumped volume:  (drops)  Interventions Interventions: Education;Breast feeding basics reviewed;Expressed milk;Comfort gels;Hand pump;DEBP;Breast massage;Hand express  Discharge Pump: Personal;Manual;DEBP  Consult Status Consult Status: Follow-up Date: 11/13/20 Follow-up type: In-patient    Connell Bognar A Higuera Ancidey 11/12/2020, 11:31 PM

## 2020-11-13 LAB — CBC
HCT: 29.3 % — ABNORMAL LOW (ref 36.0–46.0)
Hemoglobin: 9.7 g/dL — ABNORMAL LOW (ref 12.0–15.0)
MCH: 29.3 pg (ref 26.0–34.0)
MCHC: 33.1 g/dL (ref 30.0–36.0)
MCV: 88.5 fL (ref 80.0–100.0)
Platelets: 323 10*3/uL (ref 150–400)
RBC: 3.31 MIL/uL — ABNORMAL LOW (ref 3.87–5.11)
RDW: 12.5 % (ref 11.5–15.5)
WBC: 12.8 10*3/uL — ABNORMAL HIGH (ref 4.0–10.5)
nRBC: 0 % (ref 0.0–0.2)

## 2020-11-13 MED ORDER — IBUPROFEN 600 MG PO TABS: 600.0000 mg | ORAL_TABLET | Freq: Four times a day (QID) | ORAL | 0 refills | Status: AC

## 2020-11-13 NOTE — Progress Notes (Signed)
Post Partum Day 2 Subjective: no complaints, up ad lib, voiding, and tolerating PO  Objective: Blood pressure 111/78, pulse 93, temperature 98.3 F (36.8 C), temperature source Oral, resp. rate 16, height 5\' 3"  (1.6 m), weight 91.9 kg, SpO2 100 %, unknown if currently breastfeeding.  Physical Exam:  General: alert, cooperative, and appears stated age 30: appropriate Uterine Fundus: firm Incision: healing well, no significant drainage, no dehiscence DVT Evaluation: No evidence of DVT seen on physical exam. Negative Homan's sign. No cords or calf tenderness.  Recent Labs    11/11/20 0134 11/12/20 0450  HGB 12.3 8.8*  HCT 36.8 26.5*    Assessment/Plan: Discharge home and Breastfeeding   LOS: 2 days   01/13/21 11/13/2020, 7:51 AM

## 2020-11-13 NOTE — Discharge Summary (Signed)
   Postpartum Discharge Summary    Patient Name: Wendy Mason DOB: 07/19/1990 MRN: [id number]  Date of admission: 11/11/2020 Delivery date:11/11/2020  Delivering provider: MORRIS, MEGAN  Date of discharge: 11/13/2020  Admitting diagnosis: Normal labor [O80, Z37.9] Pregnancy [Z34.90] Intrauterine pregnancy: [redacted]w[redacted]d     Secondary diagnosis:  Active Problems:   Normal labor   Pregnancy  Additional problems: none    Discharge diagnosis: Term Pregnancy Delivered                                              Post partum procedures: none Augmentation: Pitocin Complications: Chorioamnionitis  Hospital course: Onset of Labor With Vaginal Delivery      30 y.o. yo G1P1001 at [redacted]w[redacted]d was admitted in Latent Labor on 11/11/2020. Patient had an uncomplicated labor course as follows:  Membrane Rupture Time/Date: 11:30 PM ,11/10/2020   Delivery Method:Vaginal, Spontaneous  Episiotomy: None  Lacerations:  2nd degree;Sulcus  Patient had an uncomplicated postpartum course.  She is ambulating, tolerating a regular diet, passing flatus, and urinating well. Patient is discharged home in stable condition on 11/13/20.  Newborn Data: Birth date:11/11/2020  Birth time:4:47 PM  Gender:Female  Living status:Living  Apgars:8 ,9  Weight:3370 g   Magnesium Sulfate received: No BMZ received: No Rhophylac:No MMR:No T-DaP:Given prenatally Flu: No Transfusion:No  Physical exam  Vitals:   11/12/20 0500 11/12/20 1400 11/12/20 2054 11/13/20 0500  BP: 101/67 107/70 128/86 111/78  Pulse: 99 99 96 93  Resp: 17 19 18 16  Temp:  98.5 F (36.9 C) 98.4 F (36.9 C) 98.3 F (36.8 C)  TempSrc:  Oral Oral Oral  SpO2:   100% 100%  Weight:      Height:       General: alert, cooperative, and no distress Lochia: appropriate Uterine Fundus: firm Incision: Healing well with no significant drainage, No significant erythema DVT Evaluation: No evidence of DVT seen on physical exam. Negative Homan's sign. No cords or calf  tenderness. Labs: Lab Results  Component Value Date   WBC 20.0 (H) 11/12/2020   HGB 8.8 (L) 11/12/2020   HCT 26.5 (L) 11/12/2020   MCV 87.7 11/12/2020   PLT 267 11/12/2020   CMP Latest Ref Rng & Units 10/23/2020  Glucose 70 - 99 mg/dL 108(H)  BUN 6 - 20 mg/dL 9  Creatinine 0.44 - 1.00 mg/dL 0.78  Sodium 135 - 145 mmol/L 134(L)  Potassium 3.5 - 5.1 mmol/L 3.6  Chloride 98 - 111 mmol/L 105  CO2 22 - 32 mmol/L 20(L)  Calcium 8.9 - 10.3 mg/dL 8.6(L)  Total Protein 6.5 - 8.1 g/dL 6.0(L)  Total Bilirubin 0.3 - 1.2 mg/dL 0.5  Alkaline Phos 38 - 126 U/L 149(H)  AST 15 - 41 U/L 16  ALT 0 - 44 U/L 14   Edinburgh Score: Edinburgh Postnatal Depression Scale Screening Tool 11/12/2020  I have been able to laugh and see the funny side of things. 1  I have looked forward with enjoyment to things. 0  I have blamed myself unnecessarily when things went wrong. 1  I have been anxious or worried for no good reason. 1  I have felt scared or panicky for no good reason. 0  Things have been getting on top of me. 1  I have been so unhappy that I have had difficulty sleeping. 0  I have felt sad or miserable.   0  I have been so unhappy that I have been crying. 0  The thought of harming myself has occurred to me. 0  Edinburgh Postnatal Depression Scale Total 4     After visit meds:  Allergies as of 11/13/2020   No Known Allergies      Medication List     TAKE these medications    cholecalciferol 25 MCG (1000 UNIT) tablet Commonly known as: VITAMIN D3 Take 1,000 Units by mouth daily.   ibuprofen 600 MG tablet Commonly known as: ADVIL Take 1 tablet (600 mg total) by mouth every 6 (six) hours.   multivitamin-prenatal 27-0.8 MG Tabs tablet Take 1 tablet by mouth daily at 12 noon.         Discharge home in stable condition Infant Feeding: Breast Infant Disposition:home with mother Discharge instruction: per After Visit Summary and Postpartum booklet. Activity: Advance as tolerated.  Pelvic rest for 6 weeks.  Diet: routine diet Future Appointments: Future Appointments  Date Time Provider Department Center  01/22/2021  7:30 AM Ahern, Antonia B, MD [facility] None   Follow up Visit:6 weeks PPV  11/13/2020 Megan Morris, DO    

## 2020-11-13 NOTE — Discharge Instructions (Signed)
Call MD for T>100.4, heavy vaginal bleeding, severe abdominal pain, or respiratory distress.  Call office to schedule postpartum visit in 6 weeks.  Pelvic rest x 6 weeks.   

## 2020-11-13 NOTE — Lactation Note (Signed)
This note was copied from a baby's chart. Lactation Consultation Note  Patient Name: Wendy Mason [id number] Date: 11/13/2020 Reason for consult: Difficult latch;Primapara;1st time breastfeeding;Early term 37-38.6wks;Infant weight loss;Other (Comment) (7 % weight loss, Bili check low intermediate) Age:30 hours As LC entered the room, baby latched in the cross cradle position, swallows noted , and fed for 10 mins/ needed stimulation intermittently during the feeding, when realized , nipple slanted , and noted the depth was not achieved.  Baby still awake and LC recommended trying with the NS #24 - baby latched easily and fed another 10 mins with swallows and more active with feeding , with small amount of milk in the NS after feeding ended. Baby was able to pull the nipple up into the NS well.  LC plan :  Breast shells between feedings except when sleeping/ until the nipple / areola complex stays elongated for a deeper latch.  Prior to latch - pre - pump with the hand pump 20 strokes / apply the NS instill EBM or formula into the top for appetizer.  Latch with firm support.  Feed for 15 -20 mins / 30 mins max / supplement with 30 ml of EBM 1st or formula ( 2nd )  Post pump both breast for 15 mins / save milk for the next feeding.  Once milk comes in work on latching on both breast .  F/U O/P placed in Epic.    Maternal Data    Feeding Mother's Current Feeding Choice: Breast Milk and Donor Milk  LATCH Score Latch: Grasps breast easily, tongue down, lips flanged, rhythmical sucking.  Audible Swallowing: A few with stimulation  Type of Nipple: Everted at rest and after stimulation (areola edema)  Comfort (Breast/Nipple): Soft / non-tender  Hold (Positioning): Assistance needed to correctly position infant at breast and maintain latch.  LATCH Score: 8   Lactation Tools Discussed/Used Tools: Shells;Pump;Flanges;Nipple Shields Nipple shield size: 24 Flange Size: 21;24 Breast pump  type: Manual;Double-Electric Breast Pump Pump Education: Milk Storage  Interventions Interventions: Breast feeding basics reviewed;Assisted with latch;Skin to skin;Breast massage;Breast compression;Adjust position;Shells;Hand pump;DEBP;Education  Discharge Discharge Education: Engorgement and breast care;Outpatient recommendation;Outpatient Epic message sent;Other (comment) (mokm receptive to coming back for [facility] O/P appt. and aware she will receive a LC F/U appt.) Pump: Personal;Manual;DEBP  Consult Status Consult Status: Complete Date: 11/13/20    Kathrin Greathouse 11/13/2020, 9:23 AM

## 2020-11-20 ENCOUNTER — Inpatient Hospital Stay (HOSPITAL_COMMUNITY): Admit: 2020-11-20 | Payer: Self-pay

## 2020-11-21 ENCOUNTER — Telehealth (HOSPITAL_COMMUNITY): Payer: Self-pay | Admitting: *Deleted

## 2020-11-21 NOTE — Telephone Encounter (Signed)
Left message to return nurse call  Duffy Rhody, RN 11/21/2020 at 2:56p

## 2021-01-22 ENCOUNTER — Ambulatory Visit: Payer: Self-pay | Admitting: Neurology

## 2021-12-27 IMAGING — US US ABDOMEN LIMITED
1 series · 15 of 25 positions shown · non-contrast
Comparison: None.

CLINICAL DATA: 29-year-old female with right upper quadrant
abdominal pain.

EXAM:
ULTRASOUND ABDOMEN LIMITED RIGHT UPPER QUADRANT

[Series 1: us abdomen limited · 15 of 39 slices shown]
[im 1/39]
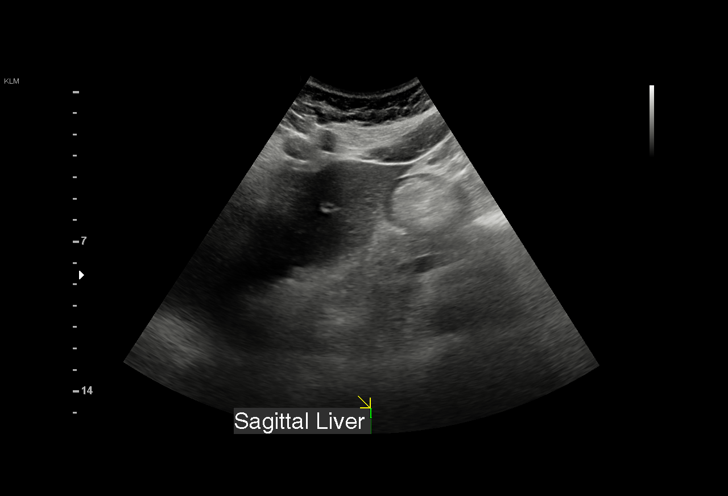
[im 4/39]
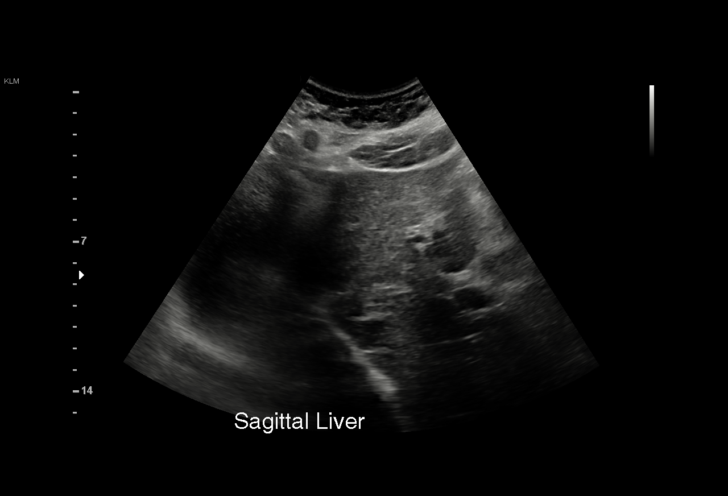
[im 7/39]
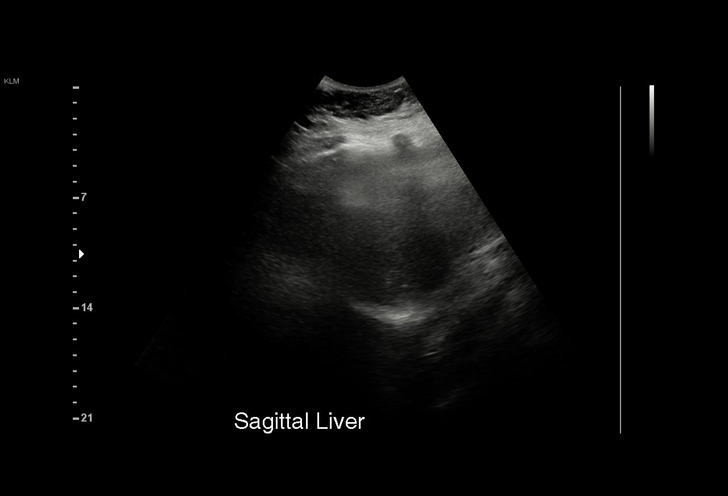
[im 8/39]
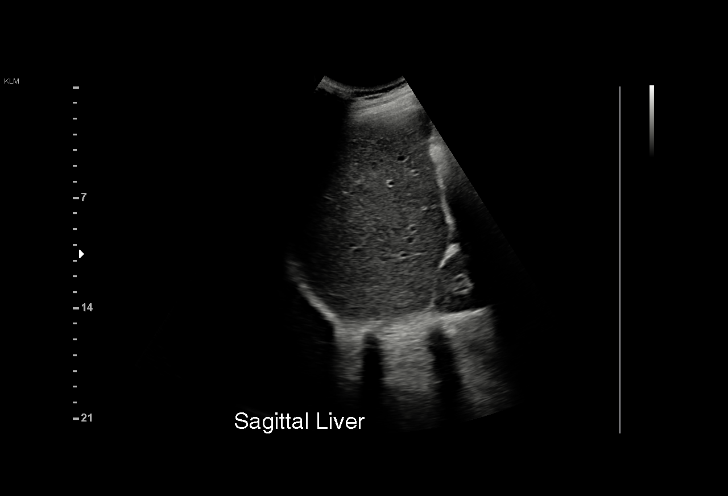
[im 12/39]
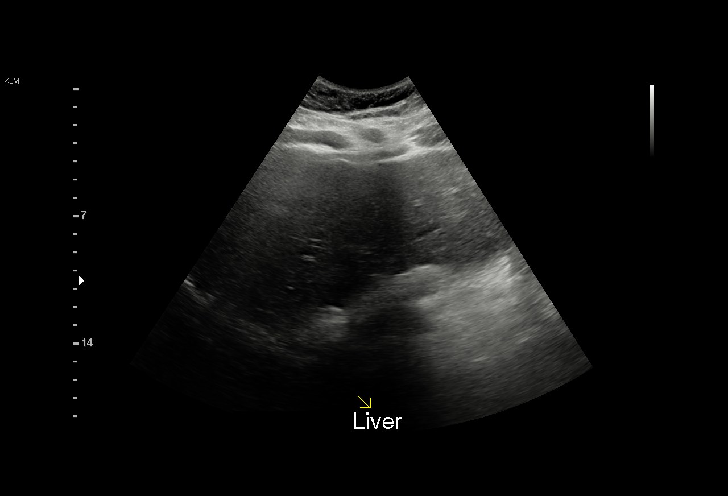
[im 15/39]
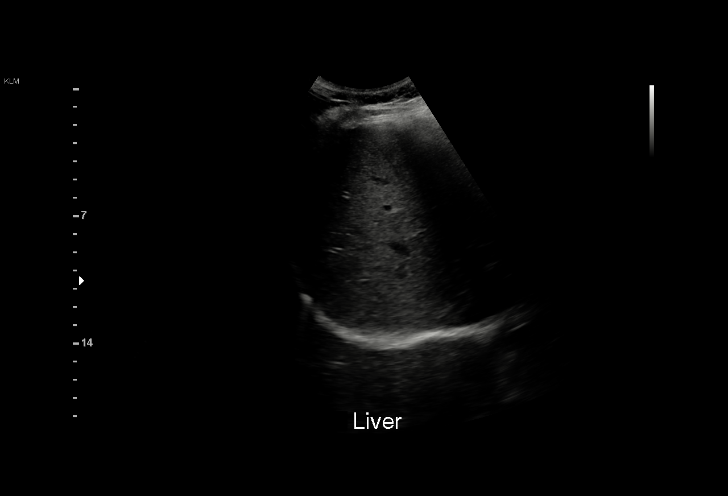
[im 16/39]
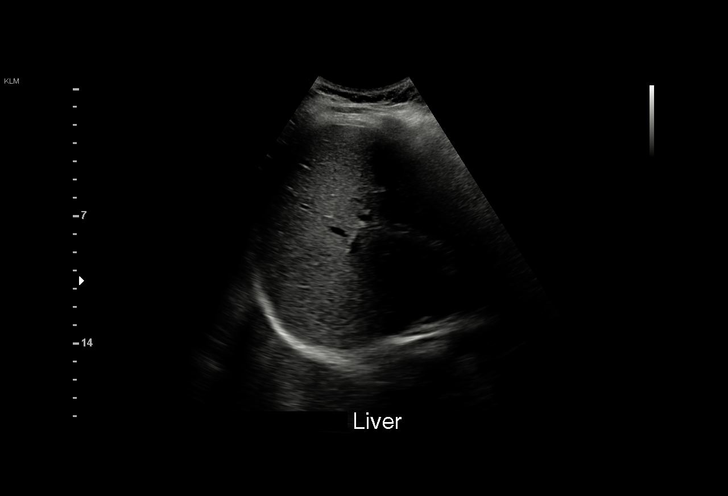
[im 20/39]
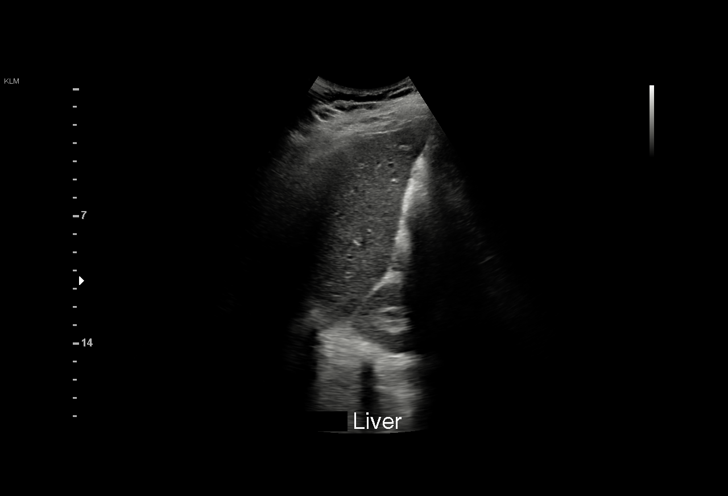
[im 23/39]
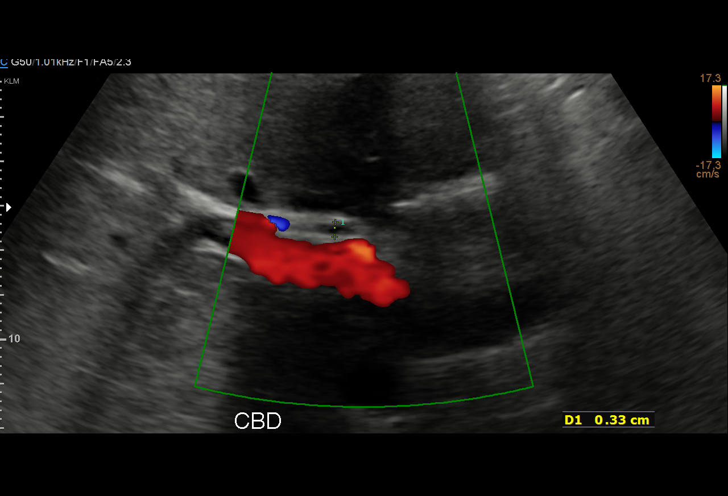
[im 24/39]
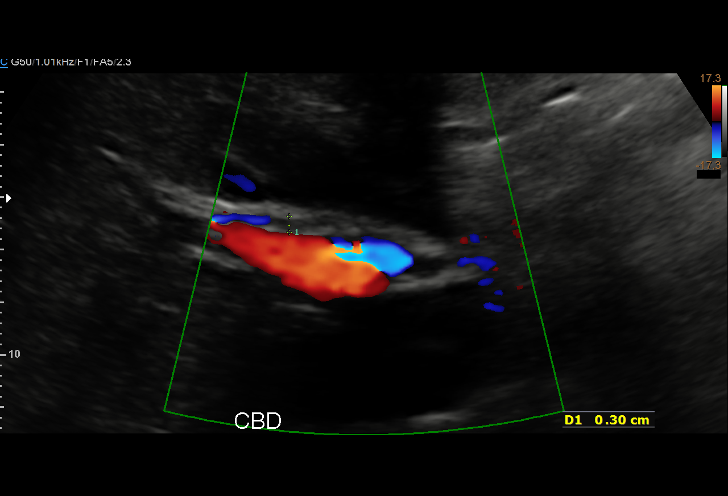
[im 27/39]
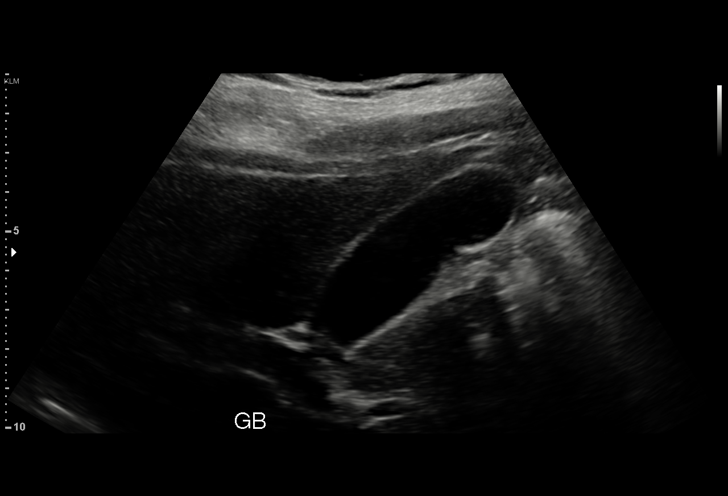
[im 31/39]
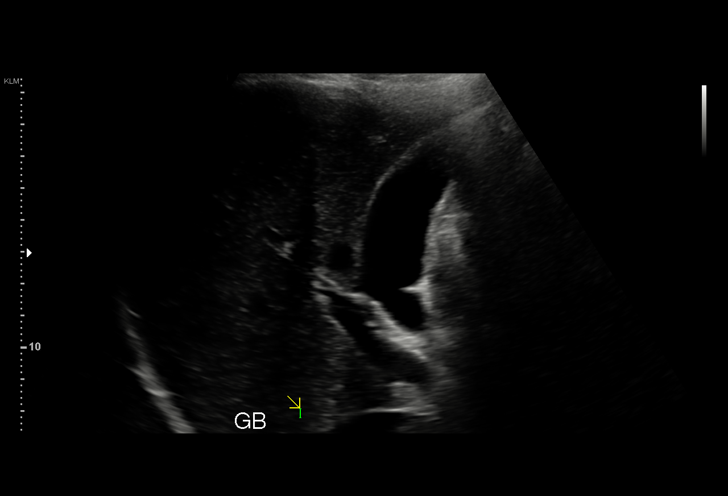
[im 32/39]
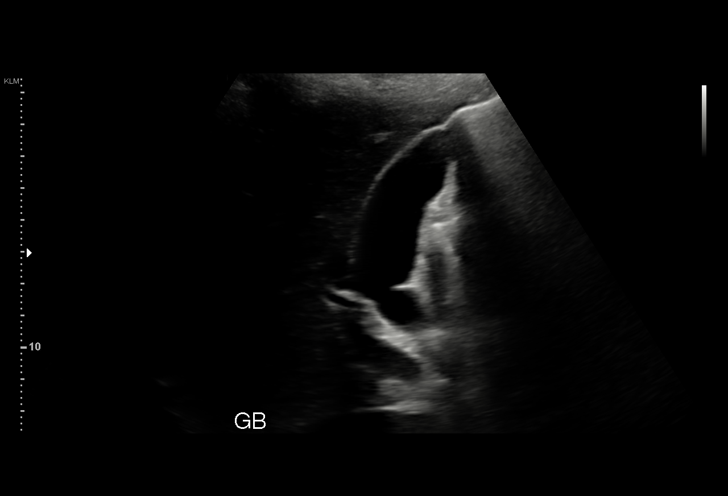
[im 35/39]
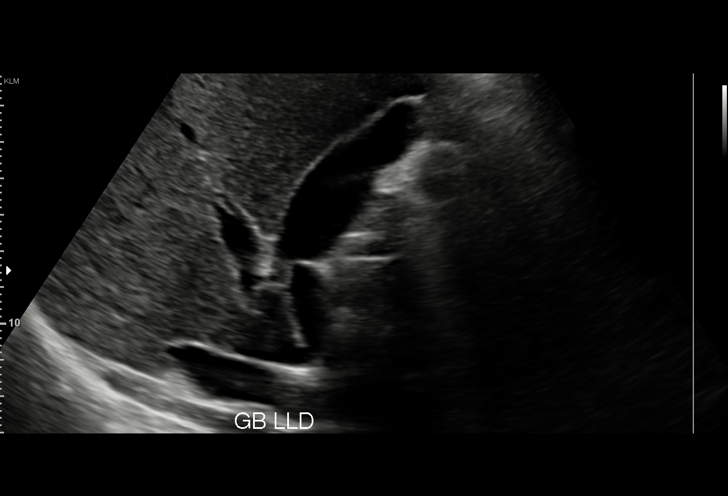
[im 39/39]
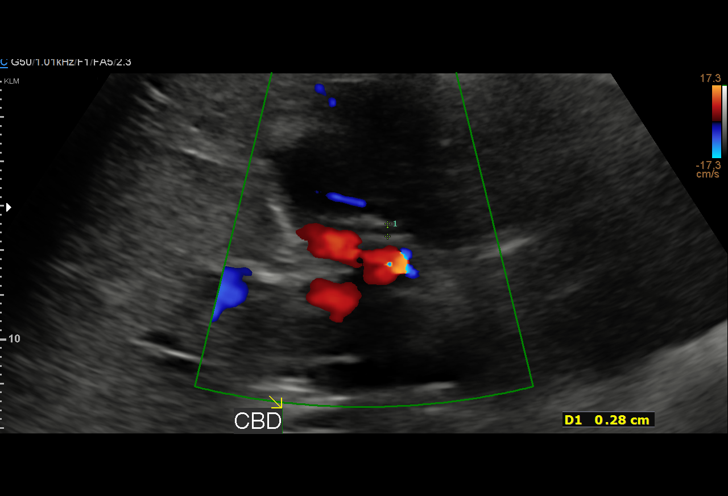

[15 of 25 positions shown; findings below may reference images not displayed]

FINDINGS: Gallbladder:

No gallstones or wall thickening visualized. No sonographic Murphy
sign noted by sonographer.

Common bile duct:

Diameter: 3 mm

Liver:

No focal lesion identified. Within normal limits in parenchymal
echogenicity. Portal vein is patent on color Doppler imaging with
normal direction of blood flow towards the liver.

Other: None.
IMPRESSION: Unremarkable right upper quadrant ultrasound.

## 2022-02-20 ENCOUNTER — Ambulatory Visit: Payer: BC Managed Care – PPO | Admitting: Physician Assistant

## 2022-02-20 ENCOUNTER — Encounter: Payer: Self-pay | Admitting: Physician Assistant

## 2022-02-20 VITALS — BP 130/88 | HR 100 | Temp 98.0°F | Ht 64.5 in | Wt 180.0 lb

## 2022-02-20 DIAGNOSIS — Z23 Encounter for immunization: Secondary | ICD-10-CM

## 2022-02-20 DIAGNOSIS — Z1159 Encounter for screening for other viral diseases: Secondary | ICD-10-CM

## 2022-02-20 DIAGNOSIS — E669 Obesity, unspecified: Secondary | ICD-10-CM | POA: Diagnosis not present

## 2022-02-20 DIAGNOSIS — Z0000 Encounter for general adult medical examination without abnormal findings: Secondary | ICD-10-CM

## 2022-02-20 DIAGNOSIS — Z136 Encounter for screening for cardiovascular disorders: Secondary | ICD-10-CM | POA: Diagnosis not present

## 2022-02-20 DIAGNOSIS — E559 Vitamin D deficiency, unspecified: Secondary | ICD-10-CM | POA: Diagnosis not present

## 2022-02-20 DIAGNOSIS — Z114 Encounter for screening for human immunodeficiency virus [HIV]: Secondary | ICD-10-CM

## 2022-02-20 DIAGNOSIS — Z13220 Encounter for screening for lipoid disorders: Secondary | ICD-10-CM

## 2022-02-20 LAB — COMPREHENSIVE METABOLIC PANEL
ALT: 8 U/L (ref 0–35)
AST: 14 U/L (ref 0–37)
Albumin: 4.6 g/dL (ref 3.5–5.2)
Alkaline Phosphatase: 78 U/L (ref 39–117)
BUN: 13 mg/dL (ref 6–23)
CO2: 27 mEq/L (ref 19–32)
Calcium: 9.4 mg/dL (ref 8.4–10.5)
Chloride: 103 mEq/L (ref 96–112)
Creatinine, Ser: 0.87 mg/dL (ref 0.40–1.20)
GFR: 89.14 mL/min (ref >60.00)
Glucose, Bld: 97 mg/dL (ref 70–99)
Potassium: 4.3 mEq/L (ref 3.5–5.1)
Sodium: 136 mEq/L (ref 135–145)
Total Bilirubin: 0.7 mg/dL (ref 0.2–1.2)
Total Protein: 7.7 g/dL (ref 6.0–8.3)

## 2022-02-20 LAB — CBC WITH DIFFERENTIAL/PLATELET
Basophils Absolute: 0.1 10*3/uL (ref 0.0–0.1)
Basophils Relative: 1 % (ref 0.0–3.0)
Eosinophils Absolute: 0.1 10*3/uL (ref 0.0–0.7)
Eosinophils Relative: 2 % (ref 0.0–5.0)
HCT: 41.7 % (ref 36.0–46.0)
Hemoglobin: 13.8 g/dL (ref 12.0–15.0)
Lymphocytes Relative: 32 % (ref 12.0–46.0)
Lymphs Abs: 2.4 10*3/uL (ref 0.7–4.0)
MCHC: 33.2 g/dL (ref 30.0–36.0)
MCV: 86.7 fl (ref 78.0–100.0)
Monocytes Absolute: 0.6 10*3/uL (ref 0.1–1.0)
Monocytes Relative: 8.5 % (ref 3.0–12.0)
Neutro Abs: 4.2 10*3/uL (ref 1.4–7.7)
Neutrophils Relative %: 56.5 % (ref 43.0–77.0)
Platelets: 437 10*3/uL — ABNORMAL HIGH (ref 150.0–400.0)
RBC: 4.81 Mil/uL (ref 3.87–5.11)
RDW: 13.1 % (ref 11.5–15.5)
WBC: 7.4 10*3/uL (ref 4.0–10.5)

## 2022-02-20 LAB — LIPID PANEL
Cholesterol: 142 mg/dL (ref 0–200)
HDL: 57.5 mg/dL (ref >39.00)
LDL Cholesterol: 73 mg/dL (ref 0–99)
NonHDL: 84.71
Total CHOL/HDL Ratio: 2
Triglycerides: 61 mg/dL (ref 0.0–149.0)
VLDL: 12.2 mg/dL (ref 0.0–40.0)

## 2022-02-20 LAB — VITAMIN D 25 HYDROXY (VIT D DEFICIENCY, FRACTURES): VITD: 30.51 ng/mL (ref 30.00–100.00)

## 2022-02-20 NOTE — Progress Notes (Signed)
Subjective:    Wendy Mason is a 31 y.o. female and is here for a comprehensive physical exam.  HPI  Social History- She reports to have no new surgeries. She does not currently smoke.  Health Maintenance Due  Topic Date Due   COVID-19 Vaccine (1) Never done   HIV Screening  Never done   Hepatitis C Screening  Never done   PAP SMEAR-Modifier  Never done   INFLUENZA VACCINE  12/11/2021    Acute Concerns: Vitamin D - She is requesting to have vitamin D levels checked. Has been low in the past.  Chronic Issues: No chronic issues.  Health Maintenance: Immunizations- She is receiving an influenza vaccine this visit. Diet -- She has a well balanced diet. Exercise -- She reports that she walks about is on her feet a lot from baby and job.  Sleep habits -- She reports to be sleeping well. Mood -- She reports to have stable mood.   UTD with dentist? - She is UTD on dental care. UTD with eye doctor? - She is not UTD on vision care.  Weight history: Wt Readings from Last 10 Encounters:  02/20/22 180 lb (81.6 kg)  11/11/20 202 lb 8 oz (91.9 kg)   Body mass index is 30.42 kg/m. Patient's last menstrual period was 02/13/2022 (exact date).  Alcohol use:  reports current alcohol use.  Tobacco use:  Tobacco Use: Low Risk  (02/20/2022)   Patient History    Smoking Tobacco Use: Never    Smokeless Tobacco Use: Never    Passive Exposure: Not on file       02/20/2022    8:10 AM  Depression screen PHQ 2/9  Decreased Interest 0  Down, Depressed, Hopeless 0  PHQ - 2 Score 0     Other providers/specialists: Patient Care Team: Inda Coke, [place] as PCP - General (Physician Assistant)    PMHx, SurgHx, SocialHx, Medications, and Allergies were reviewed in the Visit Navigator and updated as appropriate.   Past Medical History:  Diagnosis Date   Medical history non-contributory    Vaginal delivery 11/11/2020     Past Surgical History:  Procedure Laterality Date    NO PAST SURGERIES     WISDOM TOOTH EXTRACTION       Family History  Problem Relation Age of Onset   Cancer Mother    Diabetes Mother    Miscarriages / [place] Mother    Cancer Father    Hearing loss Father    Stroke Father    Hearing loss Maternal Grandfather    Stroke Maternal Grandfather    Diabetes Maternal Grandmother    Heart disease Paternal Grandfather    Early death Paternal Grandmother    Cancer Maternal Aunt     Social History   Tobacco Use   Smoking status: Never   Smokeless tobacco: Never  Vaping Use   Vaping Use: Never used  Substance Use Topics   Alcohol use: Yes    Comment: occasionally   Drug use: Never    Review of Systems:   Review of Systems  Constitutional:  Negative for chills, fever, malaise/fatigue and weight loss.  HENT:  Negative for hearing loss, sinus pain and sore throat.   Respiratory:  Negative for cough and hemoptysis.   Cardiovascular:  Negative for chest pain, palpitations, leg swelling and PND.  Gastrointestinal:  Negative for abdominal pain, constipation, diarrhea, heartburn, nausea and vomiting.  Genitourinary:  Negative for dysuria, frequency and urgency.  Musculoskeletal:  Negative for  back pain, myalgias and neck pain.  Skin:  Negative for itching and rash.  Endo/Heme/Allergies:  Negative for polydipsia.  Psychiatric/Behavioral:  Negative for depression. The patient is not nervous/anxious.     Objective:   BP 130/88 (BP Location: Left Arm, Patient Position: Sitting, Cuff Size: Normal)   Pulse 100   Temp 98 F (36.7 C) (Temporal)   Ht 5' 4.5" (1.638 m)   Wt 180 lb (81.6 kg)   LMP 02/13/2022 (Exact Date)   SpO2 96%   Breastfeeding No   BMI 30.42 kg/m  Body mass index is 30.42 kg/m.   General Appearance:    Alert, cooperative, no distress, appears stated age  Head:    Normocephalic, without obvious abnormality, atraumatic  Eyes:    PERRL, conjunctiva/corneas clear, EOM's intact, fundi    benign, both eyes   Ears:    Normal TM's and external ear canals, both ears  Nose:   Nares normal, septum midline, mucosa normal, no drainage    or sinus tenderness  Throat:   Lips, mucosa, and tongue normal; teeth and gums normal  Neck:   Supple, symmetrical, trachea midline, no adenopathy;    thyroid:  no enlargement/tenderness/nodules; no carotid   bruit or JVD  Back:     Symmetric, no curvature, ROM normal, no CVA tenderness  Lungs:     Clear to auscultation bilaterally, respirations unlabored  Chest Wall:    No tenderness or deformity   Heart:    Regular rate and rhythm, S1 and S2 normal, no murmur, rub or gallop  Breast Exam:    Deferred  Abdomen:     Soft, non-tender, bowel sounds active all four quadrants,    no masses, no organomegaly  Genitalia:    Deferred  Extremities:   Extremities normal, atraumatic, no cyanosis or edema  Pulses:   2+ and symmetric all extremities  Skin:   Skin color, texture, turgor normal, no rashes or lesions  Lymph nodes:   Cervical, supraclavicular, and axillary nodes normal  Neurologic:   CNII-XII intact, normal strength, sensation and reflexes    throughout    Assessment/Plan:   Routine physical examination Today patient counseled on age appropriate routine health concerns for screening and prevention, each reviewed and up to date or declined. Immunizations reviewed and up to date or declined. Labs ordered and reviewed. Risk factors for depression reviewed and negative. Hearing function and visual acuity are intact. ADLs screened and addressed as needed. Functional ability and level of safety reviewed and appropriate. Education, counseling and referrals performed based on assessed risks today. Patient provided with a copy of personalized plan for preventive services.  Encounter for lipid screening for cardiovascular disease Update lipid panel and make recommendations accordingly  Vitamin D deficiency Update Vit D and make recommendations accordingly  Obesity,  unspecified classification, unspecified obesity type, unspecified whether serious comorbidity present Continue healthy diet and exercise  Screening for HIV (human immunodeficiency virus) Update  Encounter for screening for other viral diseases Update   I,Verona Buck,acting as a scribe for [employer], PA.,have documented all relevant documentation on the behalf of Jarold Motto, PA,as directed by  Jarold Motto, PA while in the presence of Jarold Motto, [place].  I, Jarold Motto, [place], have reviewed all documentation for this visit. The documentation on 02/20/22 for the exam, diagnosis, procedures, and orders are all accurate and complete.  Jarold Motto, PA-C [facility]

## 2022-02-20 NOTE — Patient Instructions (Addendum)
It was great to see you!  Consider genetic counseling for screening for cancers -- let me know your thoughts.  Please go to the lab for blood work.   Our office will call you with your results unless you have chosen to receive results via MyChart.  If your blood work is normal we will follow-up each year for physicals and as scheduled for chronic medical problems.  If anything is abnormal we will treat accordingly and get you in for a follow-up.  Take care,  Aldona Bar

## 2022-02-21 LAB — HEPATITIS C ANTIBODY: Hepatitis C Ab: NONREACTIVE

## 2022-02-21 LAB — HIV ANTIBODY (ROUTINE TESTING W REFLEX): HIV 1&2 Ab, 4th Generation: NONREACTIVE

## 2023-02-25 ENCOUNTER — Encounter: Payer: BC Managed Care – PPO | Admitting: Physician Assistant

## 2023-03-07 LAB — OB RESULTS CONSOLE ANTIBODY SCREEN: Antibody Screen: NEGATIVE

## 2023-03-07 LAB — OB RESULTS CONSOLE ABO/RH: RH Type: POSITIVE

## 2023-03-07 LAB — HEPATITIS C ANTIBODY: HCV Ab: NEGATIVE

## 2023-03-07 LAB — OB RESULTS CONSOLE HIV ANTIBODY (ROUTINE TESTING): HIV: NONREACTIVE

## 2023-03-07 LAB — OB RESULTS CONSOLE HEPATITIS B SURFACE ANTIGEN: Hepatitis B Surface Ag: NEGATIVE

## 2023-03-07 LAB — OB RESULTS CONSOLE RPR: RPR: NONREACTIVE

## 2023-03-07 LAB — OB RESULTS CONSOLE RUBELLA ANTIBODY, IGM: Rubella: IMMUNE

## 2023-03-21 LAB — OB RESULTS CONSOLE GC/CHLAMYDIA
Chlamydia: NEGATIVE
Neisseria Gonorrhea: NEGATIVE

## 2023-05-14 NOTE — L&D Delivery Note (Signed)
 Delivery Note At 12:51 AM a viable female was delivered via Vaginal, Spontaneous (Presentation: Right Occiput Anterior).  APGAR: 9, 9; weight pending.   Placenta status: Spontaneous, Intact.  Cord: 3 vessels with the following complications: None.    Anesthesia: Local Episiotomy: None Lacerations: 2nd degree;Perineal Suture Repair: 3.0 vicryl Est. Blood Loss (mL): 115ccs  No IV at time of delivery - 10u of pitocin  was given IM.  Mom to postpartum.  Baby to Couplet care / Skin to Skin.  Grace Laura 09/19/2023, 1:25 AM

## 2023-07-16 LAB — OB RESULTS CONSOLE HIV ANTIBODY (ROUTINE TESTING): HIV: NONREACTIVE

## 2023-07-16 LAB — OB RESULTS CONSOLE RPR: RPR: NONREACTIVE

## 2023-09-08 LAB — OB RESULTS CONSOLE GBS: GBS: NEGATIVE

## 2023-09-19 ENCOUNTER — Encounter (HOSPITAL_COMMUNITY): Payer: Self-pay

## 2023-09-19 ENCOUNTER — Inpatient Hospital Stay (HOSPITAL_COMMUNITY)
Admission: AD | Admit: 2023-09-19 | Discharge: 2023-09-20 | DRG: 807 | Disposition: A | Discharge status: Home / Self Care | Attending: Obstetrics and Gynecology | Admitting: Obstetrics and Gynecology

## 2023-09-19 ENCOUNTER — Other Ambulatory Visit: Payer: Self-pay

## 2023-09-19 DIAGNOSIS — Z833 Family history of diabetes mellitus: Secondary | ICD-10-CM

## 2023-09-19 DIAGNOSIS — Z3A38 38 weeks gestation of pregnancy: Secondary | ICD-10-CM | POA: Diagnosis not present

## 2023-09-19 DIAGNOSIS — Z8249 Family history of ischemic heart disease and other diseases of the circulatory system: Secondary | ICD-10-CM

## 2023-09-19 DIAGNOSIS — O26893 Other specified pregnancy related conditions, third trimester: Secondary | ICD-10-CM | POA: Diagnosis present

## 2023-09-19 LAB — CBC
HCT: 36.5 % (ref 36.0–46.0)
HCT: 35.1 % — ABNORMAL LOW (ref 36.0–46.0)
Hemoglobin: 12.3 g/dL (ref 12.0–15.0)
Hemoglobin: 12 g/dL (ref 12.0–15.0)
MCH: 29.3 pg (ref 26.0–34.0)
MCH: 29.4 pg (ref 26.0–34.0)
MCHC: 33.7 g/dL (ref 30.0–36.0)
MCHC: 34.2 g/dL (ref 30.0–36.0)
MCV: 86.9 fL (ref 80.0–100.0)
MCV: 86 fL (ref 80.0–100.0)
Platelets: 308 10*3/uL (ref 150–400)
Platelets: 326 10*3/uL (ref 150–400)
RBC: 4.2 MIL/uL (ref 3.87–5.11)
RBC: 4.08 MIL/uL (ref 3.87–5.11)
RDW: 12.5 % (ref 11.5–15.5)
RDW: 12.6 % (ref 11.5–15.5)
WBC: 25.1 10*3/uL — ABNORMAL HIGH (ref 4.0–10.5)
WBC: 23.2 10*3/uL — ABNORMAL HIGH (ref 4.0–10.5)
nRBC: 0 % (ref 0.0–0.2)
nRBC: 0 % (ref 0.0–0.2)

## 2023-09-19 LAB — TYPE AND SCREEN
ABO/RH(D): A POS
Antibody Screen: NEGATIVE

## 2023-09-19 LAB — RPR: RPR Ser Ql: NONREACTIVE

## 2023-09-19 MED ORDER — OXYCODONE-ACETAMINOPHEN 5-325 MG PO TABS: 2.0000 | ORAL_TABLET | ORAL | Status: DC | PRN

## 2023-09-19 MED ORDER — COCONUT OIL OIL: 1.0000 | TOPICAL_OIL | Status: DC | PRN

## 2023-09-19 MED ORDER — FENTANYL CITRATE (PF) 100 MCG/2ML IJ SOLN
25.0000 ug | Freq: Once | INTRAMUSCULAR | Status: AC
  Administered 2023-09-19: 25 ug via INTRAMUSCULAR

## 2023-09-19 MED ORDER — SIMETHICONE 80 MG PO CHEW: 80.0000 mg | CHEWABLE_TABLET | ORAL | Status: DC | PRN

## 2023-09-19 MED ORDER — ACETAMINOPHEN 325 MG PO TABS
650.0000 mg | ORAL_TABLET | ORAL | Status: DC | PRN
  Administered 2023-09-19: 650 mg via ORAL
  Filled 2023-09-19: qty 2

## 2023-09-19 MED ORDER — BENZOCAINE-MENTHOL 20-0.5 % EX AERO: 1.0000 | INHALATION_SPRAY | CUTANEOUS | Status: DC | PRN

## 2023-09-19 MED ORDER — LACTATED RINGERS IV SOLN: 500.0000 mL | INTRAVENOUS | Status: DC | PRN

## 2023-09-19 MED ORDER — ONDANSETRON HCL 4 MG/2ML IJ SOLN: 4.0000 mg | Freq: Four times a day (QID) | INTRAMUSCULAR | Status: DC | PRN

## 2023-09-19 MED ORDER — COMPLETENATE 29-1 MG PO CHEW
1.0000 | CHEWABLE_TABLET | Freq: Every day | ORAL | Status: DC
  Administered 2023-09-19: 1 via ORAL
  Filled 2023-09-19: qty 1

## 2023-09-19 MED ORDER — OXYCODONE-ACETAMINOPHEN 5-325 MG PO TABS: 1.0000 | ORAL_TABLET | ORAL | Status: DC | PRN

## 2023-09-19 MED ORDER — OXYCODONE HCL 5 MG PO TABS: 5.0000 mg | ORAL_TABLET | ORAL | Status: DC | PRN

## 2023-09-19 MED ORDER — ONDANSETRON HCL 4 MG PO TABS: 4.0000 mg | ORAL_TABLET | ORAL | Status: DC | PRN

## 2023-09-19 MED ORDER — FENTANYL CITRATE (PF) 100 MCG/2ML IJ SOLN
25.0000 ug | Freq: Once | INTRAMUSCULAR | Status: DC
  Filled 2023-09-19: qty 2

## 2023-09-19 MED ORDER — ZOLPIDEM TARTRATE 5 MG PO TABS: 5.0000 mg | ORAL_TABLET | Freq: Every evening | ORAL | Status: DC | PRN

## 2023-09-19 MED ORDER — OXYTOCIN-SODIUM CHLORIDE 30-0.9 UT/500ML-% IV SOLN
2.5000 [IU]/h | INTRAVENOUS | Status: DC
  Filled 2023-09-19: qty 500

## 2023-09-19 MED ORDER — ONDANSETRON HCL 4 MG/2ML IJ SOLN: 4.0000 mg | INTRAMUSCULAR | Status: DC | PRN

## 2023-09-19 MED ORDER — ACETAMINOPHEN 325 MG PO TABS: 650.0000 mg | ORAL_TABLET | ORAL | Status: DC | PRN

## 2023-09-19 MED ORDER — SENNOSIDES-DOCUSATE SODIUM 8.6-50 MG PO TABS: 2.0000 | ORAL_TABLET | Freq: Every day | ORAL | Status: DC

## 2023-09-19 MED ORDER — OXYTOCIN 10 UNIT/ML IJ SOLN
INTRAMUSCULAR | Status: AC
  Administered 2023-09-19: 10 [IU] via INTRAMUSCULAR
  Filled 2023-09-19: qty 1

## 2023-09-19 MED ORDER — IBUPROFEN 600 MG PO TABS
600.0000 mg | ORAL_TABLET | Freq: Four times a day (QID) | ORAL | Status: DC
  Administered 2023-09-19 – 2023-09-20 (×5): 600 mg via ORAL
  Filled 2023-09-19 (×5): qty 1

## 2023-09-19 MED ORDER — FLEET ENEMA RE ENEM: 1.0000 | ENEMA | RECTAL | Status: DC | PRN

## 2023-09-19 MED ORDER — DIPHENHYDRAMINE HCL 25 MG PO CAPS: 25.0000 mg | ORAL_CAPSULE | Freq: Four times a day (QID) | ORAL | Status: DC | PRN

## 2023-09-19 MED ORDER — WITCH HAZEL-GLYCERIN EX PADS: 1.0000 | MEDICATED_PAD | CUTANEOUS | Status: DC | PRN

## 2023-09-19 MED ORDER — SOD CITRATE-CITRIC ACID 500-334 MG/5ML PO SOLN: 30.0000 mL | ORAL | Status: DC | PRN

## 2023-09-19 MED ORDER — TETANUS-DIPHTH-ACELL PERTUSSIS 5-2.5-18.5 LF-MCG/0.5 IM SUSY: 0.5000 mL | PREFILLED_SYRINGE | Freq: Once | INTRAMUSCULAR | Status: DC

## 2023-09-19 MED ORDER — OXYTOCIN BOLUS FROM INFUSION
333.0000 mL | Freq: Once | INTRAVENOUS | Status: DC
  Administered 2023-09-19: 333 mL via INTRAVENOUS

## 2023-09-19 MED ORDER — LIDOCAINE HCL (PF) 1 % IJ SOLN
30.0000 mL | INTRAMUSCULAR | Status: DC | PRN
  Administered 2023-09-19: 30 mL via SUBCUTANEOUS

## 2023-09-19 MED ORDER — DIBUCAINE (PERIANAL) 1 % EX OINT: 1.0000 | TOPICAL_OINTMENT | CUTANEOUS | Status: DC | PRN

## 2023-09-19 MED ORDER — PRENATAL MULTIVITAMIN CH: 1.0000 | ORAL_TABLET | Freq: Every day | ORAL | Status: DC

## 2023-09-19 NOTE — H&P (Signed)
 REATHEL HOLSMAN is a 33 y.o. female presenting in active labor. Found to be 9.5cm on presentation to MAU.  Pregnancy complicated by circumvallate placenta, normal serial growth ultrasounds - most recent 4/2 EFW 2189g.  OB History     Gravida  2   Para  1   Term  1   Preterm      AB      Living  1      SAB      IAB      Ectopic      Multiple  0   Live Births  1          Past Medical History:  Diagnosis Date   Medical history non-contributory    Vaginal delivery 11/11/2020   Past Surgical History:  Procedure Laterality Date   NO PAST SURGERIES     WISDOM TOOTH EXTRACTION     Family History: family history includes Breast cancer (age of onset: 72) in her maternal aunt; Breast cancer (age of onset: 66) in her mother; Cancer - Other in her father; Diabetes in her maternal grandmother; Diabetes type I in her mother; Hearing loss in her father and maternal grandfather; Heart attack in her paternal grandfather; Heart attack (age of onset: 15) in her father; Hypertension in her father and maternal grandmother; Miscarriages / [place] in her mother; Pancreatic cancer (age of onset: 85) in her maternal aunt; Pulmonary embolism in her paternal grandmother; Stroke in her father; Stroke (age of onset: 59) in her maternal grandfather. Social History:  reports that she has never smoked. She has never used smokeless tobacco. She reports current alcohol use. She reports that she does not use drugs.     Maternal Diabetes: No Genetic Screening: Normal Maternal Ultrasounds/Referrals: Normal Fetal Ultrasounds or other Referrals:  None Maternal Substance Abuse:  No Significant Maternal Medications:  None Significant Maternal Lab Results:  Group B Strep negative Number of Prenatal Visits:greater than 3 verified prenatal visits Maternal Vaccinations:TDap and Flu Other Comments:  None     09/19/2023    1:16 AM 09/19/2023    1:01 AM 09/19/2023   12:59 AM  Vitals with BMI   Systolic 130 140 213  Diastolic 79 104 63  Pulse 93 109 108   Blood pressure 130/79, pulse 93, temperature (!) 96.2 F (35.7 C), temperature source Axillary, resp. rate 16.  Constitutional:      Appearance: Normal appearance.  HENT:     Head: Normocephalic.  Eyes:     Pupils: Pupils are equal, round Cardiovascular:     Rate and Rhythm: Tachycardic    Pulses: Normal pulses.  Abdominal:     General: Abdomen is Gravid, nontender Neurological:     Mental Status: She is alert.  Prenatal labs: reviewed in [place] ABO, Rh:   Antibody:   Rubella:   RPR:    HBsAg:    HIV:    GBS:     Assessment/Plan: 33 yo G2P1001 at [redacted]w[redacted]d presenting for active labor. Actively pushing on arrival to L&D - see delivery note GBS neg    Grace Laura 09/19/2023, 1:21 AM

## 2023-09-19 NOTE — MAU Note (Signed)
 PT came into MAU very uncomfortable with contractions. She ruptured membranes at 2200 with clear fld. She had contractions for several hours but had to wait on family to take care of child at home. Helped to undress and into bed. 9+cm and 0 station. FHR 130 initially with variable. FHR 100 at transfer to [facility] via stretcher. Holmes Lusher CNM with pt and RN for transfer to [facility]. Tiffany RN CN on BS given report along with Genna Khan RN. Dr Farrel Hones notified of pt's admission

## 2023-09-19 NOTE — Lactation Note (Signed)
 This note was copied from a baby's chart. Lactation Consultation Note  Patient Name: Wendy Mason [id number] Date: 09/19/2023 Age:33 hours  Reason for consult: Initial assessment;Breastfeeding assistance;Early term 37-38.6wks  P2, [redacted]w[redacted]d  Initial LC visit and mother was breastfeeding her baby Wendy upon arrival to room. Infant was latched and mother reports "pinching" on the nipple. Lower lip was tucked and parents taught how to lower the lip when latched. Baby in cradle hold and mother taught cross cradle. Baby fed for 15 minutes and came off the breast content. Mother's nipple was extended and rounded. She denied discomfort. Hand expression reviewed and colostrum observed.   Mother encouraged to latch baby with feeding cues, place baby skin to skin if not latching, and call for assistance with breastfeeding as needed. Anticipate as baby approaches 24 hours of age, baby will breastfeed more often 8-12 plus times in 24 hours and may "cluster feed".    Mom made aware of O/P services, breastfeeding support groups, community resources, and our phone # for post-discharge questions. Mother has received OP Lactation Consultation support at [facility] with her last child, now 73 years old. Mother struggled with breastfeeding and pumped for 7 months.      Maternal Data Has patient been taught Hand Expression?: Yes Does the patient have breastfeeding experience prior to this delivery?: Yes How long did the patient breastfeed?: pumped for 7 months  Feeding Mother's Current Feeding Choice: Breast Milk  LATCH Score Latch: Grasps breast easily, tongue down, lips flanged, rhythmical sucking.  Audible Swallowing: A few with stimulation  Type of Nipple: Everted at rest and after stimulation  Comfort (Breast/Nipple): Soft / non-tender  Hold (Positioning): No assistance needed to correctly position infant at breast.  LATCH Score: 9    Interventions Interventions: Breast feeding basics  reviewed;Breast compression;Support pillows;Adjust position;Education;LC Services brochure;CDC milk storage guidelines;CDC Guidelines for Breast Pump Cleaning  Discharge Pump: DEBP;Personal  Consult Status Consult Status: Follow-up Date: 09/20/23 Follow-up type: In-patient    Gearline Kell M 09/19/2023, 7:11 PM

## 2023-09-20 NOTE — Discharge Summary (Signed)
 Postpartum Discharge Summary       Patient Name: Wendy Mason DOB: [birth year] MRN: [id number]  Date of admission: 09/19/2023 Delivery date:09/19/2023 Delivering provider: Grace Laura Date of discharge: 09/20/2023  Admitting diagnosis: Indication for care in labor or delivery [O75.9] Intrauterine pregnancy: [redacted]w[redacted]d     Secondary diagnosis:  Principal Problem:   Indication for care in labor or delivery  Additional problems:      Discharge diagnosis: Term Pregnancy Delivered                                              Post partum procedures:  Augmentation: N/A Complications: None  Hospital course: Onset of Labor With Vaginal Delivery      33 y.o. yo [id number] at [redacted]w[redacted]d was admitted in Active Labor on 09/19/2023. Labor course was uncomplicated  Membrane Rupture Time/Date: 10:00 PM,09/19/2023  Delivery Method:Vaginal, Spontaneous Operative Delivery:N/A Episiotomy: None Lacerations:  2nd degree;Perineal Patient had a postpartum course uncomplicated.  She is ambulating, tolerating a regular diet, passing flatus, and urinating well. Patient is discharged home in stable condition on 09/20/23.  Newborn Data: Birth date:09/19/2023 Birth time:12:51 AM Gender:Female Living status:Living Apgars:9 ,9  Weight:3300 g  Magnesium Sulfate received: No BMZ received: No Rhophylac:N/A MMR:N/A T-DaP:Given prenatally Flu: N/A RSV Vaccine received: No Transfusion:No Immunizations administered: Immunization History  Administered Date(s) Administered   Influenza,inj,Quad PF,6+ Mos 02/20/2022   Influenza-Unspecified 03/07/2020, 02/07/2021   MMR 11/13/2020   Tdap 11/03/2018, 08/28/2020    Physical exam  Vitals:   09/19/23 1246 09/19/23 1710 09/19/23 2054 09/20/23 0541  BP: 102/70 107/75 101/71 105/70  Pulse: 88 93 86 91  Resp:  20 17 18   Temp: 98.1 F (36.7 C) 98.2 F (36.8 C) 98.3 F (36.8 C)   TempSrc: Oral Oral Oral   SpO2: 98% 97% 99% 98%  Weight:      Height:       General:  alert, cooperative, and no distress Lochia: appropriate Uterine Fundus: firm Incision: N/A DVT Evaluation: No evidence of DVT seen on physical exam. Labs: Lab Results  Component Value Date   WBC 23.2 (H) 09/19/2023   HGB 12.0 09/19/2023   HCT 35.1 (L) 09/19/2023   MCV 86.0 09/19/2023   PLT 326 09/19/2023      Latest Ref Rng & Units 02/20/2022    8:45 AM  CMP  Glucose 70 - 99 mg/dL 97   BUN 6 - 23 mg/dL 13   Creatinine 1.47 - 1.20 mg/dL 8.29   Sodium 562 - 130 mEq/L 136   Potassium 3.5 - 5.1 mEq/L 4.3   Chloride 96 - 112 mEq/L 103   CO2 19 - 32 mEq/L 27   Calcium 8.4 - 10.5 mg/dL 9.4   Total Protein 6.0 - 8.3 g/dL 7.7   Total Bilirubin 0.2 - 1.2 mg/dL 0.7   Alkaline Phos 39 - 117 U/L 78   AST 0 - 37 U/L 14   ALT 0 - 35 U/L 8    Edinburgh Score:    09/19/2023    5:10 PM  Edinburgh Postnatal Depression Scale Screening Tool  I have been able to laugh and see the funny side of things. 0  I have looked forward with enjoyment to things. 0  I have blamed myself unnecessarily when things went wrong. 0  I have been anxious or worried for no good  reason. 1  I have felt scared or panicky for no good reason. 0  Things have been getting on top of me. 0  I have been so unhappy that I have had difficulty sleeping. 0  I have felt sad or miserable. 0  I have been so unhappy that I have been crying. 0  The thought of harming myself has occurred to me. 0  Edinburgh Postnatal Depression Scale Total 1      After visit meds:  Allergies as of 09/20/2023   No Known Allergies      Medication List     TAKE these medications    cholecalciferol 25 MCG (1000 UNIT) tablet Commonly known as: VITAMIN D3 Take 1,000 Units by mouth daily.   ferrous sulfate  324 MG Tbec Take 324 mg by mouth every evening.   ibuprofen  600 MG tablet Commonly known as: ADVIL  Take 1 tablet (600 mg total) by mouth every 6 (six) hours.   multivitamin tablet Take 1 tablet by mouth daily.   prenatal  multivitamin Tabs tablet Take 1 tablet by mouth daily at 12 noon.         Discharge home in stable condition Infant Feeding: Breast Infant Disposition:home with mother Discharge instruction: per After Visit Summary and Postpartum booklet. Activity: Advance as tolerated. Pelvic rest for 6 weeks.  Diet: routine diet Anticipated Birth Control: Unsure Postpartum Appointment:6 weeks Additional Postpartum F/U:   Future Appointments:No future appointments. Follow up Visit:      09/20/2023 Denette Finner, MD

## 2023-09-20 NOTE — Discharge Instructions (Signed)

## 2023-10-02 ENCOUNTER — Inpatient Hospital Stay (HOSPITAL_COMMUNITY): Admission: RE | Admit: 2023-10-02 | Source: Home / Self Care | Admitting: Obstetrics & Gynecology

## 2023-10-07 ENCOUNTER — Telehealth (HOSPITAL_COMMUNITY): Payer: Self-pay | Admitting: *Deleted

## 2023-10-07 NOTE — Telephone Encounter (Signed)
 10/07/2023  Name: Wendy Mason MRN: [id number] DOB: Nov 14, 1990  Reason for Call:  Transition of Care Hospital Discharge Call  Contact Status: Patient Contact Status: Complete  Language assistant needed: Interpreter Mode: Interpreter Not Needed        Follow-Up Questions: Do You Have Any Concerns About Your Health As You Heal From Delivery?: No Do You Have Any Concerns About Your Infants Health?: No  Edinburgh Postnatal Depression Scale:  In the Past 7 Days: I have been able to laugh and see the funny side of things.: As much as I always could I have looked forward with enjoyment to things.: As much as I ever did I have blamed myself unnecessarily when things went wrong.: No, never I have been anxious or worried for no good reason.: No, not at all I have felt scared or panicky for no good reason.: No, not at all Things have been getting on top of me.: No, I have been coping as well as ever I have been so unhappy that I have had difficulty sleeping.: Not at all I have felt sad or miserable.: No, not at all I have been so unhappy that I have been crying.: No, never The thought of harming myself has occurred to me.: Never Dimple Francis Postnatal Depression Scale Total: 0  PHQ2-9 Depression Scale:     Discharge Follow-up: Edinburgh score requires follow up?: No Patient was advised of the following resources:: Support Group, Breastfeeding Support Group  Post-discharge interventions: Reviewed Newborn Safe Sleep Practices  Pearlie Bougie, RN 10/07/2023 10:43

## 2023-11-20 ENCOUNTER — Other Ambulatory Visit: Payer: Self-pay | Admitting: Medical Genetics

## 2023-11-24 ENCOUNTER — Other Ambulatory Visit (HOSPITAL_COMMUNITY)

## 2024-02-08 ENCOUNTER — Encounter: Payer: Self-pay | Admitting: Physician Assistant

## 2024-03-15 ENCOUNTER — Other Ambulatory Visit

## 2024-03-21 ENCOUNTER — Other Ambulatory Visit: Payer: Self-pay | Admitting: Medical Genetics

## 2024-03-21 DIAGNOSIS — Z006 Encounter for examination for normal comparison and control in clinical research program: Secondary | ICD-10-CM

## 2024-04-17 LAB — GENECONNECT MOLECULAR SCREEN: Genetic Analysis Overall Interpretation: NEGATIVE

## 2024-05-04 ENCOUNTER — Ambulatory Visit (INDEPENDENT_AMBULATORY_CARE_PROVIDER_SITE_OTHER): Admitting: Physician Assistant

## 2024-05-04 ENCOUNTER — Encounter: Payer: Self-pay | Admitting: Physician Assistant

## 2024-05-04 VITALS — BP 120/78 | HR 103 | Temp 98.1°F | Ht 64.0 in | Wt 169.8 lb

## 2024-05-04 DIAGNOSIS — Z809 Family history of malignant neoplasm, unspecified: Secondary | ICD-10-CM

## 2024-05-04 DIAGNOSIS — Z0000 Encounter for general adult medical examination without abnormal findings: Secondary | ICD-10-CM | POA: Diagnosis not present

## 2024-05-04 DIAGNOSIS — Z23 Encounter for immunization: Secondary | ICD-10-CM | POA: Diagnosis not present

## 2024-05-04 LAB — COMPREHENSIVE METABOLIC PANEL WITH GFR
ALT: 13 U/L (ref 3–35)
AST: 15 U/L (ref 5–37)
Albumin: 4.6 g/dL (ref 3.5–5.2)
Alkaline Phosphatase: 67 U/L (ref 39–117)
BUN: 12 mg/dL (ref 6–23)
CO2: 26 meq/L (ref 19–32)
Calcium: 9.1 mg/dL (ref 8.4–10.5)
Chloride: 105 meq/L (ref 96–112)
Creatinine, Ser: 0.76 mg/dL (ref 0.40–1.20)
GFR: 103.23 mL/min
Glucose, Bld: 94 mg/dL (ref 70–99)
Potassium: 3.9 meq/L (ref 3.5–5.1)
Sodium: 138 meq/L (ref 135–145)
Total Bilirubin: 0.4 mg/dL (ref 0.2–1.2)
Total Protein: 6.9 g/dL (ref 6.0–8.3)

## 2024-05-04 LAB — CBC WITH DIFFERENTIAL/PLATELET
Basophils Absolute: 0 K/uL (ref 0.0–0.1)
Basophils Relative: 0.6 % (ref 0.0–3.0)
Eosinophils Absolute: 0.3 K/uL (ref 0.0–0.7)
Eosinophils Relative: 3.2 % (ref 0.0–5.0)
HCT: 39.3 % (ref 36.0–46.0)
Hemoglobin: 13.3 g/dL (ref 12.0–15.0)
Lymphocytes Relative: 34.5 % (ref 12.0–46.0)
Lymphs Abs: 2.8 K/uL (ref 0.7–4.0)
MCHC: 33.7 g/dL (ref 30.0–36.0)
MCV: 84.9 fl (ref 78.0–100.0)
Monocytes Absolute: 0.6 K/uL (ref 0.1–1.0)
Monocytes Relative: 7.8 % (ref 3.0–12.0)
Neutro Abs: 4.4 K/uL (ref 1.4–7.7)
Neutrophils Relative %: 53.9 % (ref 43.0–77.0)
Platelets: 466 K/uL — ABNORMAL HIGH (ref 150.0–400.0)
RBC: 4.63 Mil/uL (ref 3.87–5.11)
RDW: 12.4 % (ref 11.5–15.5)
WBC: 8.1 K/uL (ref 4.0–10.5)

## 2024-05-04 LAB — LIPID PANEL
Cholesterol: 89 mg/dL (ref 28–200)
HDL: 34.5 mg/dL — ABNORMAL LOW
LDL Cholesterol: 45 mg/dL (ref 10–99)
NonHDL: 54.71
Total CHOL/HDL Ratio: 3
Triglycerides: 49 mg/dL (ref 10.0–149.0)
VLDL: 9.8 mg/dL (ref 0.0–40.0)

## 2024-05-04 NOTE — Patient Instructions (Signed)
 It was great to see you!  Please go to the lab for blood work.   Our office will call you with your results unless you have chosen to receive results via MyChart.  If your blood work is normal we will follow-up each year for physicals and as scheduled for chronic medical problems.  If anything is abnormal we will treat accordingly and get you in for a follow-up.  Take care,  Lelon Mast

## 2024-05-04 NOTE — Progress Notes (Signed)
 "  Subjective:    Wendy Mason is a 33 y.o. female and is here for a comprehensive physical exam.  HPI  Health Maintenance Due  Topic Date Due   Cervical Cancer Screening (HPV/Pap Cotest)  Never done   Influenza Vaccine  12/12/2023    Discussed the use of AI scribe software for clinical note transcription with the patient, who gave verbal consent to proceed.  History of Present Illness   Wendy Mason is a 33 year old female who presents for an annual physical exam.  She gave birth in May and is breastfeeding her seven-month-old. She had an IUD placed in June with initial spotting that has resolved.  She was treated for a persistent yeast infection confirmed by swab after over-the-counter treatments failed and completed two doses of oral medication, last taken Sunday. She feels better now.  She has difficulty falling back asleep after waking with her baby. She is not using melatonin. She drinks one cup of coffee in the morning and denies other significant caffeine use.  Family history is notable for her mother with late-onset type 1 diabetes, her father with myocardial infarction at 52 and stroke at 61, and her paternal grandmother with pulmonary embolism at 71.  She denies changes in alcohol or smoking habits. She is physically active caring for her children and has no dietary restrictions. She denies gestational diabetes, iron deficiency, or thyroid problems in her recent pregnancy.  She recently had a cold and is concerned about which medications are safe while breastfeeding. She has pre-cancerous skin lesions in the past and has not seen a dermatologist recently.        Health Maintenance: Immunizations -- UpToDate  Colonoscopy -- n/a Mammogram -- n/a PAP -- utd Bone Density -- n/a Diet -- healthy diet as able Exercise -- limited due to child responsibilities  Sleep habits -- see above Mood -- overall stable  UTD with dentist? - yes UTD with eye doctor? - no  -- does not have issues  Weight history: Wt Readings from Last 10 Encounters:  05/04/24 169 lb 12.8 oz (77 kg)  09/19/23 203 lb (92.1 kg)  02/20/22 180 lb (81.6 kg)  11/11/20 202 lb 8 oz (91.9 kg)   Body mass index is 29.15 kg/m. No LMP recorded.  Alcohol use:  reports current alcohol use.  Tobacco use:  Tobacco Use: Low Risk (09/19/2023)   Patient History    Smoking Tobacco Use: Never    Smokeless Tobacco Use: Never    Passive Exposure: Not on file   Eligible for lung cancer screening? no     12 /23/2025    1:05 PM  Depression screen PHQ 2/9  Decreased Interest 0  Down, Depressed, Hopeless 0  PHQ - 2 Score 0     Other providers/specialists: Patient Care Team: Job Lukes, [place] as PCP - General (Physician Assistant) [place], [facility] as Consulting Physician (Obstetrics and Gynecology)    PMHx, SurgHx, SocialHx, Medications, and Allergies were reviewed in the Visit Navigator and updated as appropriate.   Past Medical History:  Diagnosis Date   Medical history non-contributory    Vaginal delivery 11/11/2020     Past Surgical History:  Procedure Laterality Date   NO PAST SURGERIES     WISDOM TOOTH EXTRACTION       Family History  Problem Relation Age of Onset   Breast cancer Mother 80       had genetic testing and did not have gene mutation  Diabetes type I Mother        Dx later in life   Miscarriages / Stillbirths Mother    Cancer Mother    Diabetes Mother    Cancer - Other Father        carcinoid -- on colon and liver   Hearing loss Father    Stroke Father 32       from HTN   Hypertension Father    Heart attack Father 62       CABG and stents x 2   Cancer Father    Diabetes Maternal Grandmother    Hypertension Maternal Grandmother    Hearing loss Maternal Grandfather    Stroke Maternal Grandfather 78   Pulmonary embolism Paternal Grandmother 48       unprovoked   Heart attack Paternal Grandfather        CABG x 3    Breast cancer Maternal Aunt 51       triple negative   Pancreatic cancer Maternal Aunt 59   Cancer Maternal Aunt     Social History[1]  Review of Systems:   Review of Systems  Constitutional:  Negative for chills, fever, malaise/fatigue and weight loss.  HENT:  Negative for hearing loss, sinus pain and sore throat.   Respiratory:  Negative for cough and hemoptysis.   Cardiovascular:  Negative for chest pain, palpitations, leg swelling and PND.  Gastrointestinal:  Negative for abdominal pain, constipation, diarrhea, heartburn, nausea and vomiting.  Genitourinary:  Negative for dysuria, frequency and urgency.  Musculoskeletal:  Negative for back pain, myalgias and neck pain.  Skin:  Negative for itching and rash.  Neurological:  Negative for dizziness, tingling, seizures and headaches.  Endo/Heme/Allergies:  Negative for polydipsia.  Psychiatric/Behavioral:  Negative for depression. The patient is not nervous/anxious.     Objective:   BP 120/78   Pulse (!) 103   Temp 98.1 F (36.7 C) (Temporal)   Ht 5' 4 (1.626 m)   Wt 169 lb 12.8 oz (77 kg)   SpO2 98%   BMI 29.15 kg/m  Body mass index is 29.15 kg/m.   General Appearance:    Alert, cooperative, no distress, appears stated age  Head:    Normocephalic, without obvious abnormality, atraumatic  Eyes:    PERRL, conjunctiva/corneas clear, EOM's intact, fundi    benign, both eyes  Ears:    Normal TM's and external ear canals, both ears  Nose:   Nares normal, septum midline, mucosa normal, no drainage    or sinus tenderness  Throat:   Lips, mucosa, and tongue normal; teeth and gums normal  Neck:   Supple, symmetrical, trachea midline, no adenopathy;    thyroid:  no enlargement/tenderness/nodules; no carotid   bruit or JVD  Back:     Symmetric, no curvature, ROM normal, no CVA tenderness  Lungs:     Clear to auscultation bilaterally, respirations unlabored  Chest Wall:    No tenderness or deformity   Heart:    Regular rate  and rhythm, S1 and S2 normal, no murmur, rub or gallop  Breast Exam:    Deferred   Abdomen:     Soft, non-tender, bowel sounds active all four quadrants,    no masses, no organomegaly  Genitalia:    Deferred  Extremities:   Extremities normal, atraumatic, no cyanosis or edema  Pulses:   2+ and symmetric all extremities  Skin:   Skin color, texture, turgor normal, no rashes or lesions  Lymph nodes:  Cervical, supraclavicular, and axillary nodes normal  Neurologic:   CNII-XII intact, normal strength, sensation and reflexes    throughout    Assessment/Plan:   Assessment and Plan    Woman's Wellness Visit; Family history of cancer Wellness visit addressing several health concerns, including family history, sleep issues, and recent infection. Stable mental health, no significant lifestyle changes, and improving digestion. Recent cold likely from daycare. - Ordered blood work including cholesterol levels. - Administered flu shot. - Referred to genetic counselor for further evaluation.   Lucie Buttner, PA-C [facility]           [1]  Social History Tobacco Use   Smoking status: Never   Smokeless tobacco: Never  Vaping Use   Vaping status: Never Used  Substance Use Topics   Alcohol use: Yes    Comment: occasionally   Drug use: Never   "

## 2024-05-05 ENCOUNTER — Ambulatory Visit: Payer: Self-pay | Admitting: Physician Assistant

## 2024-07-26 ENCOUNTER — Inpatient Hospital Stay

## 2024-07-26 ENCOUNTER — Inpatient Hospital Stay: Admitting: Genetic Counselor

## 2025-05-18 ENCOUNTER — Encounter: Admitting: Physician Assistant
